# Patient Record
Sex: Female | Born: 1995 | Race: Black or African American | Hispanic: No | Marital: Single | Smoking: Never smoker
Health system: Southern US, Community
[De-identification: ages and names within clinical notes are randomized; demographics above are authoritative.]

## PROBLEM LIST (undated history)

## (undated) DIAGNOSIS — J45909 Unspecified asthma, uncomplicated: Secondary | ICD-10-CM

## (undated) DIAGNOSIS — T7840XA Allergy, unspecified, initial encounter: Secondary | ICD-10-CM

## (undated) HISTORY — DX: Allergy, unspecified, initial encounter: T78.40XA

## (undated) HISTORY — DX: Unspecified asthma, uncomplicated: J45.909

## (undated) HISTORY — PX: TOOTH EXTRACTION: SUR596

## (undated) HISTORY — PX: ANTERIOR CRUCIATE LIGAMENT REPAIR: SHX115

---
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
## 2014-01-08 ENCOUNTER — Ambulatory Visit (INDEPENDENT_AMBULATORY_CARE_PROVIDER_SITE_OTHER): Admitting: Emergency Medicine

## 2014-01-08 VITALS — BP 132/80 | HR 88 | Temp 98.2°F | Resp 18 | Ht 64.0 in | Wt 203.0 lb

## 2014-01-08 DIAGNOSIS — J209 Acute bronchitis, unspecified: Secondary | ICD-10-CM

## 2014-01-08 DIAGNOSIS — J018 Other acute sinusitis: Secondary | ICD-10-CM

## 2014-01-08 MED ORDER — LEVOFLOXACIN 500 MG PO TABS: 500.0000 mg | ORAL_TABLET | Freq: Every day | ORAL | Status: AC

## 2014-01-08 MED ORDER — PROMETHAZINE-CODEINE 6.25-10 MG/5ML PO SYRP: 5.0000 mL | ORAL_SOLUTION | Freq: Four times a day (QID) | ORAL | Status: DC | PRN

## 2014-01-08 MED ORDER — ALBUTEROL SULFATE HFA 108 (90 BASE) MCG/ACT IN AERS: 2.0000 | INHALATION_SPRAY | RESPIRATORY_TRACT | Status: DC | PRN

## 2014-01-08 MED ORDER — PSEUDOEPHEDRINE-GUAIFENESIN ER 60-600 MG PO TB12: 1.0000 | ORAL_TABLET | Freq: Two times a day (BID) | ORAL | Status: AC

## 2014-01-08 MED ORDER — AMOXICILLIN-POT CLAVULANATE 875-125 MG PO TABS: 1.0000 | ORAL_TABLET | Freq: Two times a day (BID) | ORAL | Status: DC

## 2014-01-08 NOTE — Progress Notes (Signed)
[facility] [address], [place] [id number] [phone]  Date:  01/08/2014   Name:  Savannah Baldwin   DOB:  12-30-1995   MRN:  [id number]  PCP:  No primary provider on file.    Chief Complaint: Shortness of Breath, Sore Throat and Cough   History of Present Illness:  Savannah Baldwin is a 18 y.o. very pleasant female patient who presents with the following:  Ill with nasal congestion and pressure in her sinuses with purulent nasal drainage and post nasal drainage.  Has a sore throat.  Has a cough productive purulent sputum.  Some wheezing. No shortness of breath. Fever but no chills.  No nausea or vomiting. No stool change or rash.  No improvement with over the counter medications or other home remedies. Denies other complaint or health concern today.   There are no active problems to display for this patient.   Past Medical History  Diagnosis Date  . Allergy   . Asthma     History reviewed. No pertinent past surgical history.  History  Substance Use Topics  . Smoking status: Never Smoker   . Smokeless tobacco: Not on file  . Alcohol Use: Not on file    Family History  Problem Relation Age of Onset  . Hypertension Mother   . Hyperlipidemia Father     Allergies  Allergen Reactions  . Penicillins     Medication list has been reviewed and updated.  No current outpatient prescriptions on file prior to visit.   No current facility-administered medications on file prior to visit.    Review of Systems:  As per HPI, otherwise negative.    Physical Examination: Filed Vitals:   01/08/14 1518  BP: 132/80  Pulse: 88  Temp: 98.2 F (36.8 C)  Resp: 18   Filed Vitals:   01/08/14 1518  Height: 5\' 4"  (1.626 m)  Weight: 203 lb (92.08 kg)   Body mass index is 34.83 kg/(m^2). Ideal Body Weight: Weight in (lb) to have BMI = 25: 145.3  GEN: WDWN, NAD, Non-toxic, A & O x 3 HEENT: Atraumatic, Normocephalic. Neck supple. No masses, No  LAD. Ears and Nose: No external deformity. CV: RRR, No M/G/R. No JVD. No thrill. No extra heart sounds. PULM: CTA B, no wheezes, crackles, rhonchi. No retractions. No resp. distress. No accessory muscle use. ABD: S, NT, ND, +BS. No rebound. No HSM. EXTR: No c/c/e NEURO Normal gait.  PSYCH: Normally interactive. Conversant. Not depressed or anxious appearing.  Calm demeanor.    Assessment and Plan: Sinusitis Bronchitis augmentin mucinex d Phen c cod   Signed,  Phillips OdorJeffery Avleen Bordwell, MD

## 2014-01-08 NOTE — Patient Instructions (Signed)

## 2017-01-08 ENCOUNTER — Emergency Department (HOSPITAL_BASED_OUTPATIENT_CLINIC_OR_DEPARTMENT_OTHER)

## 2017-01-08 ENCOUNTER — Emergency Department (HOSPITAL_BASED_OUTPATIENT_CLINIC_OR_DEPARTMENT_OTHER)
Admission: EM | Admit: 2017-01-08 | Discharge: 2017-01-08 | Disposition: A | Discharge status: Home / Self Care | Attending: Physician Assistant | Admitting: Physician Assistant

## 2017-01-08 ENCOUNTER — Encounter (HOSPITAL_BASED_OUTPATIENT_CLINIC_OR_DEPARTMENT_OTHER): Payer: Self-pay | Admitting: Respiratory Therapy

## 2017-01-08 DIAGNOSIS — R05 Cough: Secondary | ICD-10-CM | POA: Insufficient documentation

## 2017-01-08 DIAGNOSIS — J3489 Other specified disorders of nose and nasal sinuses: Secondary | ICD-10-CM | POA: Diagnosis not present

## 2017-01-08 DIAGNOSIS — J45909 Unspecified asthma, uncomplicated: Secondary | ICD-10-CM | POA: Insufficient documentation

## 2017-01-08 DIAGNOSIS — R0602 Shortness of breath: Secondary | ICD-10-CM | POA: Diagnosis not present

## 2017-01-08 DIAGNOSIS — J029 Acute pharyngitis, unspecified: Secondary | ICD-10-CM | POA: Diagnosis not present

## 2017-01-08 DIAGNOSIS — Z79899 Other long term (current) drug therapy: Secondary | ICD-10-CM | POA: Insufficient documentation

## 2017-01-08 LAB — RAPID STREP SCREEN (MED CTR MEBANE ONLY): STREPTOCOCCUS, GROUP A SCREEN (DIRECT): NEGATIVE

## 2017-01-08 LAB — PREGNANCY, URINE: PREG TEST UR: NEGATIVE

## 2017-01-08 MED ORDER — PREDNISONE 10 MG PO TABS: 40.0000 mg | ORAL_TABLET | Freq: Every day | ORAL | 0 refills | Status: AC

## 2017-01-08 MED ORDER — ALBUTEROL SULFATE (5 MG/ML) 0.5% IN NEBU: 2.5000 mg | INHALATION_SOLUTION | Freq: Four times a day (QID) | RESPIRATORY_TRACT | 12 refills | Status: DC | PRN

## 2017-01-08 MED ORDER — ALBUTEROL SULFATE HFA 108 (90 BASE) MCG/ACT IN AERS
2.0000 | INHALATION_SPRAY | RESPIRATORY_TRACT | Status: DC | PRN
  Administered 2017-01-08: 2 via RESPIRATORY_TRACT
  Filled 2017-01-08: qty 6.7

## 2017-01-08 MED ORDER — ALBUTEROL SULFATE (2.5 MG/3ML) 0.083% IN NEBU
5.0000 mg | INHALATION_SOLUTION | Freq: Once | RESPIRATORY_TRACT | Status: AC
  Administered 2017-01-08: 5 mg via RESPIRATORY_TRACT
  Filled 2017-01-08: qty 6

## 2017-01-08 MED ORDER — IPRATROPIUM-ALBUTEROL 0.5-2.5 (3) MG/3ML IN SOLN
3.0000 mL | Freq: Four times a day (QID) | RESPIRATORY_TRACT | Status: DC
  Administered 2017-01-08: 3 mL via RESPIRATORY_TRACT
  Filled 2017-01-08: qty 3

## 2017-01-08 NOTE — ED Notes (Signed)
Ambulate pt on pulse OX pt maintained 94-95% O2 without issue. Pt stated short of breath after returning to bed.

## 2017-01-08 NOTE — ED Provider Notes (Signed)
[facility] Provider [facility]   CSN: [id number] Arrival date & time: 01/08/17  1449     History   Chief Complaint Chief Complaint  Patient presents with  . Shortness of Breath    HPI Savannah Manisndrea Baldwin is a 21 y.o. female who presents to the Emergency Department with SOB with associated wheezing, non-productive cough, and sore throat that began yesterday and worsened today.  Denies fever, chills, rash, difficulty swallowing, CP, abdominal pain, or N/V/D. She reports SOB is worse with exertion. No alleviating factors. No treatment PTA. She reports a h/o of childhood asthma, but no recent exacerbations. She states she has an expired albuterol inhaler at home. No previous hospitalizations or intubations.   HPI  Past Medical History:  Diagnosis Date  . Allergy   . Asthma     There are no active problems to display for this patient.   History reviewed. No pertinent surgical history.  OB History    No data available     Home Medications    Prior to Admission medications   Medication Sig Start Date End Date Taking? Authorizing Provider  Etonogestrel (NEXPLANON [place]) Inject into the skin.   Yes Historical Provider, MD  albuterol (PROVENTIL) (5 MG/ML) 0.5% nebulizer solution Take 0.5 mLs (2.5 mg total) by nebulization every 6 (six) hours as needed for wheezing or shortness of breath. 01/08/17   Alvis Pulcini A Kay Shippy, PA-C  predniSONE (DELTASONE) 10 MG tablet Take 4 tablets (40 mg total) by mouth daily with breakfast. 01/08/17 01/12/17  Londell Noll A Taquana Bartley, PA-C  promethazine-codeine (PHENERGAN WITH CODEINE) 6.25-10 MG/5ML syrup Take 5-10 mLs by mouth every 6 (six) hours as needed. 01/08/14   Carmelina DaneJeffery S Anderson, MD    Family History Family History  Problem Relation Age of Onset  . Hypertension Mother   . Hyperlipidemia Father     Social History Social History  Substance Use Topics  . Smoking status: Never Smoker  . Smokeless tobacco: Never Used  . Alcohol use Yes     Allergies     Penicillins   Review of Systems Review of Systems  Constitutional: Negative for appetite change and fatigue.  HENT: Positive for rhinorrhea and sore throat. Negative for congestion, ear discharge and sinus pressure.   Eyes: Negative for discharge.  Respiratory: Positive for cough, shortness of breath and wheezing. Negative for apnea.   Cardiovascular: Negative for chest pain.  Gastrointestinal: Negative for abdominal pain, diarrhea, nausea and vomiting.  Genitourinary: Negative for frequency and hematuria.  Musculoskeletal: Negative for back pain.  Skin: Negative for rash.  Neurological: Negative for seizures and headaches.  Psychiatric/Behavioral: Negative for hallucinations.   Physical Exam Updated Vital Signs BP 132/90 (BP Location: Right Arm)   Pulse (!) 103   Temp 98.3 F (36.8 C) (Oral)   Resp 18   Ht 5' 3.25" (1.607 m)   Wt 88.6 kg   SpO2 96%   BMI 34.33 kg/m   Physical Exam  Constitutional: She appears well-developed and well-nourished. No distress.  HENT:  Head: Normocephalic and atraumatic.  Nose: Mucosal edema and rhinorrhea present.  Mouth/Throat: Uvula is midline and mucous membranes are normal. Posterior oropharyngeal erythema present. No tonsillar abscesses. Tonsillar exudate.  Eyes: Conjunctivae are normal.  Neck: Neck supple.  Cardiovascular: Normal rate and regular rhythm.   No murmur heard. Pulmonary/Chest: Effort normal. No respiratory distress. She has wheezes.  Abdominal: Soft. There is no tenderness.  Musculoskeletal: She exhibits no edema.  Neurological: She is alert.  Skin: Skin is warm  and dry.  Psychiatric: She has a normal mood and affect.  Nursing note and vitals reviewed.    ED Treatments / Results  Labs (all labs ordered are listed, but only abnormal results are displayed) Labs Reviewed  RAPID STREP SCREEN (NOT AT [facility])  CULTURE, GROUP A STREP [facility])  PREGNANCY, URINE    EKG  EKG Interpretation None       Radiology Dg  Chest 2 View  Result Date: 01/08/2017 CLINICAL DATA:  Dyspnea for 2 days. Cough for 2 days. History of asthma. EXAM: CHEST  2 VIEW COMPARISON:  04/28/2004 FINDINGS: The heart size and mediastinal contours are within normal limits. Both lungs are clear. The visualized skeletal structures are unremarkable. IMPRESSION: No active cardiopulmonary disease. Electronically Signed   By: Elige Ko   On: 01/08/2017 17:00    Procedures Procedures (including critical care time)  Medications Ordered in ED Medications  albuterol (PROVENTIL) (2.5 MG/3ML) 0.083% nebulizer solution 5 mg (5 mg Nebulization Given 01/08/17 1507)    Initial Impression / Assessment and Plan / ED Course  I have reviewed the triage vital signs and the nursing notes.  Pertinent labs & imaging results that were available during my care of the patient were reviewed by me and considered in my medical decision making (see chart for details).     Patient ambulated in ED with O2 saturations maintained >90, no current signs of respiratory distress. Lung exam improved after nebulizer treatment. Will dc with 4 day burst and new albuterol inhaler. Pt states they are breathing at baseline. Pt has been instructed to continue using prescribed medications and to speak with PCP about today's exacerbation.   Final Clinical Impressions(s) / ED Diagnoses   Final diagnoses:  SOB (shortness of breath)    New Prescriptions Discharge Medication List as of 01/08/2017  6:21 PM    START taking these medications   Details  albuterol (PROVENTIL) (5 MG/ML) 0.5% nebulizer solution Take 0.5 mLs (2.5 mg total) by nebulization every 6 (six) hours as needed for wheezing or shortness of breath., Starting Tue 01/08/2017, Print    predniSONE (DELTASONE) 10 MG tablet Take 4 tablets (40 mg total) by mouth daily with breakfast., Starting Tue 01/08/2017, Until Sat 01/12/2017, Print         Shirel Mallis A Alaisa Moffitt, PA-C 01/09/17 0127    Courteney Lyn Mackuen,  MD 01/13/17 6045

## 2017-01-08 NOTE — ED Notes (Signed)
Pt walking to xray at this time. Speaking full sentences

## 2017-01-08 NOTE — ED Triage Notes (Signed)
Sob. Wheezing. Able to speak in complete sentences. No resp distress.

## 2017-01-08 NOTE — Discharge Instructions (Signed)
Please follow up with your primary care provider in one week. Return to the Emergency Department if you symptoms worsen or if you develop new symptoms.

## 2017-01-11 LAB — CULTURE, GROUP A STREP (THRC)

## 2017-04-25 ENCOUNTER — Encounter (HOSPITAL_BASED_OUTPATIENT_CLINIC_OR_DEPARTMENT_OTHER): Payer: Self-pay | Admitting: *Deleted

## 2017-04-25 ENCOUNTER — Emergency Department (HOSPITAL_BASED_OUTPATIENT_CLINIC_OR_DEPARTMENT_OTHER)

## 2017-04-25 ENCOUNTER — Emergency Department (HOSPITAL_BASED_OUTPATIENT_CLINIC_OR_DEPARTMENT_OTHER)
Admission: EM | Admit: 2017-04-25 | Discharge: 2017-04-25 | Disposition: A | Discharge status: Home / Self Care | Attending: Emergency Medicine | Admitting: Emergency Medicine

## 2017-04-25 DIAGNOSIS — J45909 Unspecified asthma, uncomplicated: Secondary | ICD-10-CM | POA: Insufficient documentation

## 2017-04-25 DIAGNOSIS — S39012A Strain of muscle, fascia and tendon of lower back, initial encounter: Secondary | ICD-10-CM | POA: Diagnosis not present

## 2017-04-25 DIAGNOSIS — Y9389 Activity, other specified: Secondary | ICD-10-CM | POA: Insufficient documentation

## 2017-04-25 DIAGNOSIS — Y999 Unspecified external cause status: Secondary | ICD-10-CM | POA: Insufficient documentation

## 2017-04-25 DIAGNOSIS — Y92410 Unspecified street and highway as the place of occurrence of the external cause: Secondary | ICD-10-CM | POA: Diagnosis not present

## 2017-04-25 DIAGNOSIS — S3992XA Unspecified injury of lower back, initial encounter: Secondary | ICD-10-CM | POA: Diagnosis present

## 2017-04-25 LAB — PREGNANCY, URINE: PREG TEST UR: NEGATIVE

## 2017-04-25 MED ORDER — ACETAMINOPHEN 500 MG PO TABS
ORAL_TABLET | ORAL | Status: AC
  Administered 2017-04-25: 1000 mg
  Filled 2017-04-25: qty 2

## 2017-04-25 MED ORDER — NAPROXEN 375 MG PO TABS: 375.0000 mg | ORAL_TABLET | Freq: Two times a day (BID) | ORAL | 0 refills | Status: DC

## 2017-04-25 MED ORDER — METHOCARBAMOL 500 MG PO TABS: 500.0000 mg | ORAL_TABLET | Freq: Two times a day (BID) | ORAL | 0 refills | Status: DC

## 2017-04-25 MED ORDER — METHOCARBAMOL 500 MG PO TABS
ORAL_TABLET | ORAL | Status: AC
  Administered 2017-04-25: 1000 mg
  Filled 2017-04-25: qty 2
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
## 2017-11-04 ENCOUNTER — Emergency Department (HOSPITAL_BASED_OUTPATIENT_CLINIC_OR_DEPARTMENT_OTHER)

## 2017-11-04 ENCOUNTER — Other Ambulatory Visit: Payer: Self-pay

## 2017-11-04 ENCOUNTER — Emergency Department (HOSPITAL_BASED_OUTPATIENT_CLINIC_OR_DEPARTMENT_OTHER)
Admission: EM | Admit: 2017-11-04 | Discharge: 2017-11-05 | Disposition: A | Discharge status: Home / Self Care | Attending: Emergency Medicine | Admitting: Emergency Medicine

## 2017-11-04 ENCOUNTER — Encounter (HOSPITAL_BASED_OUTPATIENT_CLINIC_OR_DEPARTMENT_OTHER): Payer: Self-pay | Admitting: *Deleted

## 2017-11-04 DIAGNOSIS — J45909 Unspecified asthma, uncomplicated: Secondary | ICD-10-CM | POA: Diagnosis not present

## 2017-11-04 DIAGNOSIS — R110 Nausea: Secondary | ICD-10-CM

## 2017-11-04 DIAGNOSIS — R509 Fever, unspecified: Secondary | ICD-10-CM | POA: Diagnosis present

## 2017-11-04 DIAGNOSIS — R059 Cough, unspecified: Secondary | ICD-10-CM

## 2017-11-04 DIAGNOSIS — J069 Acute upper respiratory infection, unspecified: Secondary | ICD-10-CM | POA: Diagnosis not present

## 2017-11-04 DIAGNOSIS — Z79899 Other long term (current) drug therapy: Secondary | ICD-10-CM | POA: Diagnosis not present

## 2017-11-04 DIAGNOSIS — R05 Cough: Secondary | ICD-10-CM

## 2017-11-04 MED ORDER — ONDANSETRON 8 MG PO TBDP
8.0000 mg | ORAL_TABLET | Freq: Once | ORAL | Status: AC
  Administered 2017-11-05: 8 mg via ORAL
  Filled 2017-11-04: qty 1

## 2017-11-04 MED ORDER — SODIUM CHLORIDE 0.9 % IV BOLUS (SEPSIS)
1000.0000 mL | Freq: Once | INTRAVENOUS | Status: AC
  Administered 2017-11-05: 1000 mL via INTRAVENOUS

## 2017-11-04 NOTE — ED Triage Notes (Signed)
Pt c/o flu like symptoms x 4 days, cough fever, n/d

## 2017-11-04 NOTE — ED Notes (Signed)
Patient transported to X-ray 

## 2017-11-05 LAB — BASIC METABOLIC PANEL
Anion gap: 9 (ref 5–15)
BUN: 9 mg/dL (ref 6–20)
CALCIUM: 8.9 mg/dL (ref 8.9–10.3)
CO2: 23 mmol/L (ref 22–32)
Chloride: 104 mmol/L (ref 101–111)
Creatinine, Ser: 0.7 mg/dL (ref 0.44–1.00)
GFR calc Af Amer: >60 mL/min (ref >60)
GFR calc non Af Amer: >60 mL/min (ref >60)
GLUCOSE: 112 mg/dL — AB (ref 65–99)
POTASSIUM: 3.2 mmol/L — AB (ref 3.5–5.1)
SODIUM: 136 mmol/L (ref 135–145)

## 2017-11-05 LAB — PREGNANCY, URINE: Preg Test, Ur: NEGATIVE

## 2017-11-05 LAB — RAPID STREP SCREEN (MED CTR MEBANE ONLY): STREPTOCOCCUS, GROUP A SCREEN (DIRECT): NEGATIVE

## 2017-11-05 MED ORDER — ONDANSETRON HCL 4 MG PO TABS: 4.0000 mg | ORAL_TABLET | Freq: Three times a day (TID) | ORAL | 0 refills | Status: DC | PRN

## 2017-11-05 MED ORDER — DM-GUAIFENESIN ER 30-600 MG PO TB12: 1.0000 | ORAL_TABLET | Freq: Two times a day (BID) | ORAL | 0 refills | Status: AC

## 2017-11-05 MED ORDER — BENZONATATE 100 MG PO CAPS: 100.0000 mg | ORAL_CAPSULE | Freq: Three times a day (TID) | ORAL | 0 refills | Status: DC

## 2017-11-05 MED ORDER — ALBUTEROL SULFATE HFA 108 (90 BASE) MCG/ACT IN AERS
1.0000 | INHALATION_SPRAY | Freq: Four times a day (QID) | RESPIRATORY_TRACT | 0 refills | Status: DC | PRN
Start: 2017-11-05 — End: 2018-09-09

## 2017-11-05 MED ORDER — POTASSIUM CHLORIDE CRYS ER 20 MEQ PO TBCR: 20.0000 meq | EXTENDED_RELEASE_TABLET | Freq: Every day | ORAL | 0 refills | Status: DC

## 2017-11-05 NOTE — ED Provider Notes (Signed)
[facility] EMERGENCY DEPARTMENT Provider Note   CSN: [id number] Arrival date & time: 11/04/17  2302     History   Chief Complaint Chief Complaint  Patient presents with  . Influenza    HPI Savannah Baldwin is a 22 y.o. female.  HPI  Patient is a 22 year old female with a history of asthma and allergic rhinitis presenting for fever, cough, congestion, rhinorrhea, nausea, and loose stools.  Patient reports that her symptoms began 3 days ago.  Patient reports that she has had general body soreness, and congestion that interferes with breathing while lying down.  Patient reports that she has not been able to record a fever at home, however she has felt chilled.  Additionally, patient reports she began having a sore throat 2 days ago, that is mostly irritated when she coughs now.  Patient has been nauseous without vomiting.  Patient reports she had 2 episodes of loose stools today, but they were not watery.  Patient denies blood in stools.  Patient reports that she did not get the flu shot this year.  Patient has had no recent sick contacts.  Patient has taken TheraFlu and ibuprofen for her symptoms without full relief.  Past Medical History:  Diagnosis Date  . Allergy   . Asthma     There are no active problems to display for this patient.   History reviewed. No pertinent surgical history.  OB History    No data available       Home Medications    Prior to Admission medications   Medication Sig Start Date End Date Taking? Authorizing Provider  ibuprofen (ADVIL,MOTRIN) 400 MG tablet Take 400 mg by mouth every 6 (six) hours as needed.   Yes [provider]  Pseudoephedrine-DM-GG-APAP (THERAFLU MAX-D COLD & FLU PO) Take by mouth.   Yes [provider]  albuterol (PROVENTIL) (5 MG/ML) 0.5% nebulizer solution Take 0.5 mLs (2.5 mg total) by nebulization every 6 (six) hours as needed for wheezing or shortness of breath. 01/08/17   McDonald, Mia A, PA-C    Etonogestrel (NEXPLANON [place]) Inject into the skin.    [provider]  naproxen (NAPROSYN) 375 MG tablet Take 1 tablet (375 mg total) by mouth 2 (two) times daily. 04/25/17   Palumbo, April, MD  promethazine-codeine (PHENERGAN WITH CODEINE) 6.25-10 MG/5ML syrup Take 5-10 mLs by mouth every 6 (six) hours as needed. 01/08/14   Carmelina Dane, MD    Family History Family History  Problem Relation Age of Onset  . Hypertension Mother   . Hyperlipidemia Father     Social History Social History   Tobacco Use  . Smoking status: Never Smoker  . Smokeless tobacco: Never Used  Substance Use Topics  . Alcohol use: Yes  . Drug use: Not on file     Allergies   Penicillins   Review of Systems Review of Systems  Constitutional: Positive for chills. Negative for fever.  HENT: Positive for congestion, rhinorrhea and sore throat. Negative for trouble swallowing and voice change.   Eyes: Negative for visual disturbance.  Respiratory: Positive for cough. Negative for wheezing.   Cardiovascular: Negative for chest pain.  Gastrointestinal: Positive for diarrhea and nausea. Negative for abdominal pain and vomiting.  Genitourinary: Negative for dysuria.  Musculoskeletal: Positive for myalgias.  Skin: Negative for rash.  Neurological: Positive for headaches.     Physical Exam Updated Vital Signs BP (!) 149/87   Pulse (!) 116   Temp 99.7 F (37.6 C)  Resp 20   Ht 5\' 4"  (1.626 m)   Wt 79.4 kg (175 lb)   SpO2 96%   BMI 30.04 kg/m   Physical Exam  Constitutional: She appears well-developed and well-nourished. No distress.  HENT:  Head: Normocephalic and atraumatic.  Mouth/Throat: Oropharynx is clear and moist.  Normal phonation. No muffled voice sounds. Patient swallows secretions without difficulty. Dentition normal. No lesions of tongue or buccal mucosa. Uvula midline. No asymmetric swelling of the posterior pharynx. No erythema of posterior pharynx. No tonsillar  exuduate. No lingual swelling. No induration inferior to tongue. No submandibular tenderness, swelling, or induration.  Tissues of the neck supple. No cervical lymphadenopathy. Right TM without erythema or effusion; left TM without erythema or effusion.  Eyes: Conjunctivae and EOM are normal. Pupils are equal, round, and reactive to light.  Neck: Normal range of motion. Neck supple.  No nuchal rigidity.  No meningeal signs.  Negative Brudzinski sign.  Cardiovascular: Normal rate, regular rhythm, S1 normal and S2 normal.  No murmur heard. Pulmonary/Chest: Effort normal and breath sounds normal. She has no wheezes. She has no rales.  Abdominal: Soft. She exhibits no distension. There is no tenderness. There is no guarding.  Musculoskeletal: Normal range of motion. She exhibits no edema or deformity.  Lymphadenopathy:    She has no cervical adenopathy.  Neurological: She is alert.  Cranial nerves grossly intact. Patient moves extremities symmetrically and with good coordination.  Skin: Skin is warm and dry. No rash noted. No erythema.  Psychiatric: She has a normal mood and affect. Her behavior is normal. Judgment and thought content normal.  Nursing note and vitals reviewed.    ED Treatments / Results  Labs (all labs ordered are listed, but only abnormal results are displayed) Labs Reviewed  BASIC METABOLIC PANEL - Abnormal; Notable for the following components:      Result Value   Potassium 3.2 (*)    Glucose, Bld 112 (*)    All other components within normal limits  RAPID STREP SCREEN (NOT AT [facility])  CULTURE, GROUP A STREP [facility]  PREGNANCY, URINE    EKG  EKG Interpretation None       Radiology Dg Chest 2 View  Result Date: 11/04/2017 CLINICAL DATA:  Cough and fever EXAM: CHEST  2 VIEW COMPARISON:  01/08/2017 FINDINGS: Mild bronchitic changes. No focal consolidation or effusion. Normal heart size. No pneumothorax IMPRESSION: Mild bronchitic changes.  No focal pulmonary  infiltrate Electronically Signed   By: Jasmine PangKim  Fujinaga M.D.   On: 11/04/2017 23:58    Procedures Procedures (including critical care time)  Medications Ordered in ED Medications  sodium chloride 0.9 % bolus 1,000 mL (1,000 mLs Intravenous New Bag/Given 11/05/17 0004)  ondansetron (ZOFRAN-ODT) disintegrating tablet 8 mg (8 mg Oral Given 11/05/17 0004)     Initial Impression / Assessment and Plan / ED Course  I have reviewed the triage vital signs and the nursing notes.  Pertinent labs & imaging results that were available during my care of the patient were reviewed by me and considered in my medical decision making (see chart for details).      Final Clinical Impressions(s) / ED Diagnoses   Final diagnoses:  Viral URI  Nausea  Cough   Patient is nontoxic-appearing, afebrile, and in no acute distress.  Patient is not tachycardic.  Pulse was rechecked on my evaluation and found to be 82 after 1 L normal saline repletion.  Patient with likely viral syndrome.  No evidence of pneumonia on  chest x-ray.  Chest x-ray with bronchitic thickening possibly consistent with bronchitis.  Rapid strep is negative.  Will treat patient symptomatically with Zofran, Mucinex DM, Tessalon Perles, and refill of albuterol prescription.  Abdomen is benign and without focal tenderness.  Do not suspect acute abdominal  pathology as cause of nausea and loose stool.  Incidentally, patient was found to have potassium of 3.2.  This will be repleted with 3 days of oral potassium.  This is a supervised visit with Dr. Brock Bad. Evaluation, management, and discharge planning discussed with this attending physician, who is in agreement with the plan of care.  Patient can return precautions for any persistent fever or chills, intractable nausea or vomiting, focal abdominal tenderness, dizziness, lightheadedness or syncope.  Patient is in understanding and agrees with the plan of care.  ED Discharge Orders    None        Delia Chimes 11/05/17 0149    Aviva Kluver B, PA-C 11/05/17 0150    Paula Libra, MD 11/05/17 [phone]

## 2017-11-05 NOTE — Discharge Instructions (Signed)
Please read and follow all provided instructions.  Your diagnoses today include:  1. Viral URI   2. Nausea   3. Cough     You appear to have an upper respiratory infection (URI). An upper respiratory tract infection, or cold, is a viral infection of the air passages leading to the lungs. It should improve gradually after 5-7 days. You may have a lingering cough that lasts for 2- 4 weeks after the infection.  Tests performed today include: Vital signs. See below for your results today.  Chest x-ray Swab of the throat for strep  Medications prescribed:   Take any prescribed medications only as directed. Treatment for your infection is aimed at treating the symptoms. There are no medications, such as antibiotics, that will cure your infection.   Zofran for nausea.  He may take this every 8 hours as needed for nausea.  Mucinex DM.  He may take this twice a day to help suppress the cough and loosen up the mucus in your nose and chest.  I refilled your albuterol inhaler prescription  You will take 3 days of potassium.  You may also eat the foods listed below to increase her potassium.  On testing today, your potassium level was 3.2  which is slightly below normal. I suspect this is due to diarrhea. Increasing potassium in your diet should be sufficient to make sure you are getting enough potassium. Below is a list of good sources and servings high in potassium (in order from greatest to least):  -1 cup cooked acorn squash -1 baked potato with skin -1 cup cooked spinach -1 cup cooked lentils -1 cup cooked kidney beans -Watermelon - cup raisins -1 cup plain yogurt -1 cup orange juice, frozen -Banana -1 cup 1% low-fat milk -1 cup cooked broccoli   Home care instructions:  Follow any educational materials contained in this packet.   Your illness is contagious and can be spread to others, especially during the first 3 or 4 days. It cannot be cured by antibiotics or other  medicines. Take basic precautions such as washing your hands often, covering your mouth when you cough or sneeze, and avoiding public places where you could spread your illness to others.   Please continue drinking plenty of fluids.  Use over-the-counter medicines as needed as directed on packaging for symptom relief.  You may also use ibuprofen or tylenol as directed on packaging for pain or fever.  Do not take multiple medicines containing Tylenol or acetaminophen to avoid taking too much of this medication.  Follow-up instructions: Please follow-up with your primary care provider in the next 3 days for further evaluation of your symptoms if you are not feeling better.   Return instructions:  Please return to the Emergency Department if you experience worsening symptoms.  RETURN IMMEDIATELY IF you develop shortness of breath, confusion or altered mental status, a new rash, become dizzy, faint, or poorly responsive, or are unable to be cared for at home. Please return if you have persistent vomiting and cannot keep down fluids or develop a fever that is not controlled by tylenol or motrin.   Return if you have any increasing or focal belly pain that is persistent without pressing on it. Please return if you have any other emergent concerns.  Additional Information:  Your blood pressure is elevated today.  I would like you to follow-up with her primary care provider that I list in this paperwork.  There is also a resource guide attached to  this paperwork.  To find a primary care or specialty doctor please call [phone] or [phone] to access "[facility] Find a Doctor Service."  You may also go on the [facility] website at [email or web address]  There are also multiple [facility] practices throughout the [place] that are frequently accepting new patients. You may find a clinic that is close to your home and contact them.  [facility] - [address] [place] [phone]  [facility] in [place] (also locations in [place] and [place]) - [address] [employer] [place] [phone]  [facility] - [address] [phone]    Your vital signs today were: BP (!) 149/87    Pulse (!) 116    Temp 99.7 F (37.6 C)    Resp 20    Ht 5\' 4"  (1.626 m)    Wt 79.4 kg (175 lb)    SpO2 96%    BMI 30.04 kg/m  If your blood pressure (BP) was elevated above 135/85 this visit, please have this repeated by your doctor within one month. --------------

## 2017-11-07 LAB — CULTURE, GROUP A STREP (THRC)

## 2018-09-03 ENCOUNTER — Encounter (HOSPITAL_COMMUNITY): Payer: Self-pay | Admitting: Emergency Medicine

## 2018-09-03 ENCOUNTER — Emergency Department (HOSPITAL_COMMUNITY)
Admission: EM | Admit: 2018-09-03 | Discharge: 2018-09-04 | Disposition: A | Discharge status: Home / Self Care | Payer: Self-pay | Attending: Emergency Medicine | Admitting: Emergency Medicine

## 2018-09-03 ENCOUNTER — Other Ambulatory Visit: Payer: Self-pay

## 2018-09-03 ENCOUNTER — Emergency Department (HOSPITAL_COMMUNITY): Payer: Self-pay

## 2018-09-03 DIAGNOSIS — Z79899 Other long term (current) drug therapy: Secondary | ICD-10-CM | POA: Insufficient documentation

## 2018-09-03 DIAGNOSIS — J45909 Unspecified asthma, uncomplicated: Secondary | ICD-10-CM | POA: Insufficient documentation

## 2018-09-03 DIAGNOSIS — R1031 Right lower quadrant pain: Secondary | ICD-10-CM | POA: Insufficient documentation

## 2018-09-03 DIAGNOSIS — N12 Tubulo-interstitial nephritis, not specified as acute or chronic: Secondary | ICD-10-CM | POA: Insufficient documentation

## 2018-09-03 LAB — COMPREHENSIVE METABOLIC PANEL
ALBUMIN: 3.9 g/dL (ref 3.5–5.0)
ALT: 44 U/L (ref 0–44)
AST: 36 U/L (ref 15–41)
Alkaline Phosphatase: 61 U/L (ref 38–126)
Anion gap: 12 (ref 5–15)
BUN: 8 mg/dL (ref 6–20)
CALCIUM: 8.9 mg/dL (ref 8.9–10.3)
CO2: 22 mmol/L (ref 22–32)
CREATININE: 0.99 mg/dL (ref 0.44–1.00)
Chloride: 101 mmol/L (ref 98–111)
GFR calc Af Amer: >60 mL/min (ref >60)
GFR calc non Af Amer: >60 mL/min (ref >60)
Glucose, Bld: 113 mg/dL — ABNORMAL HIGH (ref 70–99)
POTASSIUM: 3.4 mmol/L — AB (ref 3.5–5.1)
Sodium: 135 mmol/L (ref 135–145)
TOTAL PROTEIN: 7.8 g/dL (ref 6.5–8.1)
Total Bilirubin: 1.9 mg/dL — ABNORMAL HIGH (ref 0.3–1.2)

## 2018-09-03 LAB — URINALYSIS, ROUTINE W REFLEX MICROSCOPIC
Bilirubin Urine: NEGATIVE
GLUCOSE, UA: NEGATIVE mg/dL
Ketones, ur: 80 mg/dL — AB
NITRITE: NEGATIVE
PROTEIN: 100 mg/dL — AB
SPECIFIC GRAVITY, URINE: 1.014 (ref 1.005–1.030)
WBC, UA: >50 WBC/hpf — ABNORMAL HIGH (ref 0–5)
pH: 6 (ref 5.0–8.0)

## 2018-09-03 LAB — I-STAT BETA HCG BLOOD, ED (MC, WL, AP ONLY): I-stat hCG, quantitative: 5.6 m[IU]/mL — ABNORMAL HIGH (ref <5)

## 2018-09-03 LAB — CBC
HCT: 44 % (ref 36.0–46.0)
Hemoglobin: 14.7 g/dL (ref 12.0–15.0)
MCH: 27.4 pg (ref 26.0–34.0)
MCHC: 33.4 g/dL (ref 30.0–36.0)
MCV: 82.1 fL (ref 80.0–100.0)
PLATELETS: 216 10*3/uL (ref 150–400)
RBC: 5.36 MIL/uL — ABNORMAL HIGH (ref 3.87–5.11)
RDW: 13.9 % (ref 11.5–15.5)
WBC: 18.6 10*3/uL — AB (ref 4.0–10.5)
nRBC: 0 % (ref 0.0–0.2)

## 2018-09-03 LAB — LIPASE, BLOOD: Lipase: 20 U/L (ref 11–51)

## 2018-09-03 LAB — HCG, QUANTITATIVE, PREGNANCY

## 2018-09-03 MED ORDER — SODIUM CHLORIDE (PF) 0.9 % IJ SOLN
INTRAMUSCULAR | Status: AC
  Filled 2018-09-03: qty 50

## 2018-09-03 MED ORDER — IOPAMIDOL (ISOVUE-300) INJECTION 61%
INTRAVENOUS | Status: AC
  Filled 2018-09-03: qty 100

## 2018-09-03 MED ORDER — MORPHINE SULFATE (PF) 4 MG/ML IV SOLN
4.0000 mg | Freq: Once | INTRAVENOUS | Status: AC
  Administered 2018-09-04: 4 mg via INTRAVENOUS
  Filled 2018-09-03: qty 1

## 2018-09-03 MED ORDER — ONDANSETRON HCL 4 MG/2ML IJ SOLN
4.0000 mg | Freq: Once | INTRAMUSCULAR | Status: AC
  Administered 2018-09-03: 4 mg via INTRAVENOUS
  Filled 2018-09-03: qty 2

## 2018-09-03 MED ORDER — IOPAMIDOL (ISOVUE-300) INJECTION 61%
100.0000 mL | Freq: Once | INTRAVENOUS | Status: AC | PRN
  Administered 2018-09-03: 100 mL via INTRAVENOUS

## 2018-09-03 MED ORDER — MORPHINE SULFATE (PF) 4 MG/ML IV SOLN
4.0000 mg | Freq: Once | INTRAVENOUS | Status: AC
  Administered 2018-09-03: 4 mg via INTRAVENOUS
  Filled 2018-09-03: qty 1

## 2018-09-03 MED ORDER — ONDANSETRON HCL 4 MG/2ML IJ SOLN
4.0000 mg | Freq: Once | INTRAMUSCULAR | Status: AC
  Administered 2018-09-04: 4 mg via INTRAVENOUS
  Filled 2018-09-03: qty 2

## 2018-09-03 MED ORDER — SODIUM CHLORIDE 0.9 % IV BOLUS
1000.0000 mL | Freq: Once | INTRAVENOUS | Status: AC
  Administered 2018-09-03: 1000 mL via INTRAVENOUS

## 2018-09-03 NOTE — ED Notes (Addendum)
Pt in 6/10 pain

## 2018-09-03 NOTE — ED Provider Notes (Signed)
[facility]-EMERGENCY DEPT Provider Note   CSN: [id number] Arrival date & time: 09/03/18  1854     History   Chief Complaint Chief Complaint  Patient presents with  . Abdominal Pain    HPI Savannah Baldwin is a 22 y.o. female with history of asthma who presents with a 2-day history of right lower quadrant pain.  Patient has had associated nausea vomiting and constipation.  Patient reports for the past 2 days, but no urinary symptoms preceding her pain.  She has had some pain wrapping around her back.  She denies any abnormal vaginal bleeding or discharge.  LMP 08/27/2018 and was normal.  Patient has had a fever up to 104.2 and took Tylenol prior to arrival.  No diarrhea or bloody stools.  Patient was sent from urgent care for further evaluation.  Patient denies any chest pain or shortness of breath.  She has no concern for STD exposure.  She is sexually active and has Nexplanon for birth control.  Last PO intake at 1230 today with the patient tried to have a bite of soup, however vomited and had no appetite.  HPI  Past Medical History:  Diagnosis Date  . Allergy   . Asthma     There are no active problems to display for this patient.   History reviewed. No pertinent surgical history.   OB History   None      Home Medications    Prior to Admission medications   Medication Sig Start Date End Date Taking? Authorizing Provider  acetaminophen (TYLENOL) 160 MG/5ML liquid Take 500 mg by mouth every 4 (four) hours as needed for fever.    Yes [provider]  albuterol (PROVENTIL HFA;VENTOLIN HFA) 108 (90 Base) MCG/ACT inhaler Inhale 1-2 puffs into the lungs every 6 (six) hours as needed for wheezing or shortness of breath. 11/05/17  Yes [place] Scrape, Alyssa B, PA-C  esomeprazole (NEXIUM) 40 MG capsule Take 40 mg by mouth daily as needed for heartburn.   Yes [provider]  Etonogestrel (NEXPLANON [place]) Inject into the skin.   Yes [provider]  naproxen sodium (ALEVE) 220 MG tablet Take 440 mg by mouth daily as needed (pain).   Yes [provider]  benzonatate (TESSALON) 100 MG capsule Take 1 capsule (100 mg total) by mouth every 8 (eight) hours. Patient not taking: Reported on 09/03/2018 11/05/17   Aviva Kluver B, PA-C  naproxen (NAPROSYN) 375 MG tablet Take 1 tablet (375 mg total) by mouth 2 (two) times daily. Patient not taking: Reported on 09/03/2018 04/25/17   Palumbo, April, MD  ondansetron (ZOFRAN) 4 MG tablet Take 1 tablet (4 mg total) by mouth every 8 (eight) hours as needed for nausea or vomiting. Patient not taking: Reported on 09/03/2018 11/05/17   Aviva Kluver B, PA-C  potassium chloride SA (K-DUR,KLOR-CON) 20 MEQ tablet Take 1 tablet (20 mEq total) by mouth daily. Patient not taking: Reported on 09/03/2018 11/05/17   Elisha Ponder, PA-C  promethazine-codeine (PHENERGAN WITH CODEINE) 6.25-10 MG/5ML syrup Take 5-10 mLs by mouth every 6 (six) hours as needed. Patient not taking: Reported on 09/03/2018 01/08/14   Carmelina Dane, MD    Family History Family History  Problem Relation Age of Onset  . Hypertension Mother   . Hyperlipidemia Father     Social History Social History   Tobacco Use  . Smoking status: Never Smoker  . Smokeless tobacco: Never Used  Substance Use Topics  . Alcohol use: Yes  .  Drug use: Not on file     Allergies   Penicillins   Review of Systems Review of Systems  Constitutional: Positive for fever. Negative for chills.  HENT: Negative for facial swelling and sore throat.   Respiratory: Negative for shortness of breath.   Cardiovascular: Negative for chest pain.  Gastrointestinal: Positive for abdominal pain, constipation, nausea and vomiting. Negative for blood in stool and diarrhea.  Genitourinary: Positive for frequency. Negative for dysuria, vaginal bleeding and vaginal discharge.  Musculoskeletal: Positive for back pain.  Skin: Negative for rash and wound.   Neurological: Negative for headaches.  Psychiatric/Behavioral: The patient is not nervous/anxious.      Physical Exam Updated Vital Signs BP 124/74 (BP Location: Left Arm)   Pulse (!) 112   Temp 98.8 F (37.1 C) (Oral)   Resp 20   LMP 08/27/2018   SpO2 100%   Physical Exam  Constitutional: She appears well-developed and well-nourished. No distress.  HENT:  Head: Normocephalic and atraumatic.  Mouth/Throat: Oropharynx is clear and moist. No oropharyngeal exudate.  Eyes: Pupils are equal, round, and reactive to light. Conjunctivae are normal. Right eye exhibits no discharge. Left eye exhibits no discharge. No scleral icterus.  Neck: Normal range of motion. Neck supple. No thyromegaly present.  Cardiovascular: Normal rate, regular rhythm, normal heart sounds and intact distal pulses. Exam reveals no gallop and no friction rub.  No murmur heard. Pulmonary/Chest: Effort normal and breath sounds normal. No stridor. No respiratory distress. She has no wheezes. She has no rales.  Abdominal: Soft. Bowel sounds are normal. She exhibits no distension. There is tenderness in the right lower quadrant. There is tenderness at McBurney's point. There is no rebound and no guarding.    Positive Rovsing's and obturator sign  Musculoskeletal: She exhibits no edema.       Back:  Lymphadenopathy:    She has no cervical adenopathy.  Neurological: She is alert. Coordination normal.  Skin: Skin is warm and dry. No rash noted. She is not diaphoretic. No pallor.  Psychiatric: She has a normal mood and affect.  Nursing note and vitals reviewed.    ED Treatments / Results  Labs (all labs ordered are listed, but only abnormal results are displayed) Labs Reviewed  COMPREHENSIVE METABOLIC PANEL - Abnormal; Notable for the following components:      Result Value   Potassium 3.4 (*)    Glucose, Bld 113 (*)    Total Bilirubin 1.9 (*)    All other components within normal limits  CBC - Abnormal;  Notable for the following components:   WBC 18.6 (*)    RBC 5.36 (*)    All other components within normal limits  I-STAT BETA HCG BLOOD, ED ([facility], [facility], [facility] ONLY) - Abnormal; Notable for the following components:   I-stat hCG, quantitative 5.6 (*)    All other components within normal limits  LIPASE, BLOOD  HCG, QUANTITATIVE, PREGNANCY  URINALYSIS, ROUTINE W REFLEX MICROSCOPIC    EKG None  Radiology No results found.  Procedures Procedures (including critical care time)  Medications Ordered in ED Medications  sodium chloride 0.9 % bolus 1,000 mL (1,000 mLs Intravenous New Bag/Given 09/03/18 2209)  morphine 4 MG/ML injection 4 mg (4 mg Intravenous Given 09/03/18 2205)  ondansetron (ZOFRAN) injection 4 mg (4 mg Intravenous Given 09/03/18 2203)     Initial Impression / Assessment and Plan / ED Course  I have reviewed the triage vital signs and the nursing notes.  Pertinent labs & imaging  results that were available during my care of the patient were reviewed by me and considered in my medical decision making (see chart for details).     Patient presenting with abdominal pain concerning for appendicitis.  She began with right lower quadrant pain 2 days ago.  She has had nausea vomiting and decreased appetite.  She has had urinary frequency only after the pain began.  Prior to that, she was feeling well and had no urinary symptoms.  Patient has leukocytosis of 18.6.  Mild hypokalemia 3.4.  Total bilirubin elevated at 1.9.  UA is pending.  CT abdomen pelvis is pending.  Patient has no vaginal discharge or pelvic pain.  She has no concern for STD exposure.  No indication for pelvic exam at this time.  Suspect appendicitis versus urinary process. At shift change, [facility] for continued evaluation, follow up of CTAP and determination of disposition.     Final Clinical Impressions(s) / ED Diagnoses   Final diagnoses:  Right lower quadrant abdominal  pain    ED Discharge Orders    None       Verdis PrimeLaw, Treyson Axel M, PA-C 09/03/18 2253    Benjiman CorePickering, Nathan, MD 09/03/18 2324

## 2018-09-03 NOTE — ED Provider Notes (Signed)
Care assumed from Glenford BayleyAlex Law at shift change with CT pending.  Please see her notes for further details  In brief, this patient is a 22 year old female presenting with 2-day history of right lower quadrant abdominal pain with associated nausea, vomiting and diarrhea.  She also did report that she had associated fever of 104.2 at home today which she took Tylenol prior to arrival.  Patient did report urinary symptoms that began today as well.  Her last p.o. intake was at 1230 today.    PLAN: CT scan. No indication for pelvic at this time. NPO since 1230. R/o Appendicitis vs pyelo.   Gen: afebrile, VSS (tachycardia improved) HEENT: Atraumatic, EOMI Resp: no resp distress CV: RRR Abd: soft, NT, ND. + left CVA tenderness.  MsK: moving all extremities well Neuro: A&O [phone]  MDM:  22 year old female presenting with lower abdominal pain, nausea, vomiting as well as flank pain.  On my assessment patient is complaining of suprapubic abdominal pain as well as left flank pain.  She reports that her pain was in the right lower quadrant on initial assessment but after pain medication this is resolved.  Patient does have positive CVA tenderness.  Her urine does show signs of infection.  She reported fever at home.  She is afebrile here.  CT scan reviewed and shows no evidence of appendicitis.  Patient does have CT scan findings that is suspicious for pyelonephritis.  Suspect pyelonephritis. Patient does have a penicillin allergy.  Urine culture was sent. She is tolerating PO fluids here. VSS. Will tx with bactrim. I advised the patient to follow-up with pcp this week. Specific return precautions discussed. Time was given for all questions to be answered. The patient verbalized understanding and agreement with plan. The patient appears safe for discharge home.  1. Pyelonephritis   2. Right lower quadrant abdominal pain       Princella PellegriniMaczis, Deseray Daponte M, PA-C 09/04/18 0103    Paula LibraMolpus, John, MD 09/04/18 [phone]

## 2018-09-03 NOTE — ED Triage Notes (Signed)
Patient c/o RLQ pain with N/V x3 days. Reports taking tylenol PTA for fever 104.2. Afebrile in triage. Denies diarrhea. [facility]

## 2018-09-04 MED ORDER — FLUCONAZOLE 150 MG PO TABS: 150.0000 mg | ORAL_TABLET | Freq: Once | ORAL | 0 refills | Status: AC

## 2018-09-04 MED ORDER — ONDANSETRON HCL 4 MG PO TABS: 4.0000 mg | ORAL_TABLET | Freq: Four times a day (QID) | ORAL | 0 refills | Status: AC

## 2018-09-04 MED ORDER — SULFAMETHOXAZOLE-TRIMETHOPRIM 800-160 MG PO TABS: 1.0000 | ORAL_TABLET | Freq: Two times a day (BID) | ORAL | 0 refills | Status: DC

## 2018-09-04 MED ORDER — NAPROXEN 500 MG PO TABS: 500.0000 mg | ORAL_TABLET | Freq: Two times a day (BID) | ORAL | 0 refills | Status: DC

## 2018-09-04 NOTE — ED Notes (Signed)
Pt transported to CT ?

## 2018-09-04 NOTE — Discharge Instructions (Signed)
Your CT scan and lab work were consistent with a kidney infection also called pyelonephritis.  I attached a handout on this.   Please take all of your antibiotics until finished!   You may develop abdominal discomfort or diarrhea from the antibiotic.  You may help offset this with probiotics which you can buy or get in yogurt. Do not eat or take the probiotics until 2 hours after your antibiotic. Do not take your medicine if develop an itchy rash, swelling in your mouth or lips, or difficulty breathing.  Please take zofran as needed for nausea.  Take Naproxen as directed for fever and pain.  Follow up with your pcp this week.   SEEK IMMEDIATE MEDICAL CARE IF:  The pain does not go away.  You have a fever >101 that does not go down with medication. You keep throwing up (vomiting) or cannot drink liquids.  You have shaking chills (rigors) The pain becomes localized (Pain in the right side could possibly be appendicitis. In an adult, pain in the left lower portion of the abdomen could be colitis or diverticulitis). You pass bloody or black tarry stools.  There is bright red blood in the stool.  There is blood in your vomit. Your bowel movements stop (become blocked) or you cannot pass gas.  The constipation stays for more than 4 days.  You have bloody, frequent, or painful urination.  You have yellow discoloration in the skin or whites of the eyes.  Your stomach becomes bloated or bigger.  You have dizziness or fainting.  You have chest or back pain. You have rectal pain.  You do not seem to be getting better.  You have any questions or concerns.   Your vital signs today were: BP 139/74 (BP Location: Left Arm)    Pulse (!) 101    Temp 98.8 F (37.1 C) (Oral)    Resp (!) 22    LMP 08/27/2018    SpO2 98%  If your blood pressure (bp) was elevated above 135/85 this visit, please have this repeated by your doctor within one month.

## 2018-09-05 ENCOUNTER — Emergency Department (HOSPITAL_BASED_OUTPATIENT_CLINIC_OR_DEPARTMENT_OTHER)
Admission: EM | Admit: 2018-09-05 | Discharge: 2018-09-06 | Disposition: A | Discharge status: Home / Self Care | Attending: Emergency Medicine | Admitting: Emergency Medicine

## 2018-09-05 ENCOUNTER — Encounter (HOSPITAL_BASED_OUTPATIENT_CLINIC_OR_DEPARTMENT_OTHER): Payer: Self-pay | Admitting: *Deleted

## 2018-09-05 ENCOUNTER — Other Ambulatory Visit: Payer: Self-pay

## 2018-09-05 DIAGNOSIS — N12 Tubulo-interstitial nephritis, not specified as acute or chronic: Secondary | ICD-10-CM | POA: Insufficient documentation

## 2018-09-05 DIAGNOSIS — J45909 Unspecified asthma, uncomplicated: Secondary | ICD-10-CM | POA: Insufficient documentation

## 2018-09-05 DIAGNOSIS — Z79899 Other long term (current) drug therapy: Secondary | ICD-10-CM | POA: Insufficient documentation

## 2018-09-05 LAB — URINALYSIS, MICROSCOPIC (REFLEX)

## 2018-09-05 LAB — CBC WITH DIFFERENTIAL/PLATELET
ABS IMMATURE GRANULOCYTES: 0.05 10*3/uL (ref 0.00–0.07)
BASOS PCT: 1 %
Basophils Absolute: 0.1 10*3/uL (ref 0.0–0.1)
Eosinophils Absolute: 0.1 10*3/uL (ref 0.0–0.5)
Eosinophils Relative: 1 %
HCT: 39.8 % (ref 36.0–46.0)
HEMOGLOBIN: 13.2 g/dL (ref 12.0–15.0)
IMMATURE GRANULOCYTES: 1 %
LYMPHS PCT: 12 %
Lymphs Abs: 1.3 10*3/uL (ref 0.7–4.0)
MCH: 27 pg (ref 26.0–34.0)
MCHC: 33.2 g/dL (ref 30.0–36.0)
MCV: 81.4 fL (ref 80.0–100.0)
Monocytes Absolute: 1.4 10*3/uL — ABNORMAL HIGH (ref 0.1–1.0)
Monocytes Relative: 13 %
Neutro Abs: 8.1 10*3/uL — ABNORMAL HIGH (ref 1.7–7.7)
Neutrophils Relative %: 72 %
PLATELETS: 204 10*3/uL (ref 150–400)
RBC: 4.89 MIL/uL (ref 3.87–5.11)
RDW: 14.1 % (ref 11.5–15.5)
WBC: 10.8 10*3/uL — ABNORMAL HIGH (ref 4.0–10.5)
nRBC: 0 % (ref 0.0–0.2)

## 2018-09-05 LAB — COMPREHENSIVE METABOLIC PANEL
ALBUMIN: 3.4 g/dL — AB (ref 3.5–5.0)
ALT: 55 U/L — AB (ref 0–44)
AST: 41 U/L (ref 15–41)
Alkaline Phosphatase: 79 U/L (ref 38–126)
Anion gap: 12 (ref 5–15)
BUN: 9 mg/dL (ref 6–20)
CHLORIDE: 100 mmol/L (ref 98–111)
CO2: 20 mmol/L — AB (ref 22–32)
Calcium: 8.8 mg/dL — ABNORMAL LOW (ref 8.9–10.3)
Creatinine, Ser: 1 mg/dL (ref 0.44–1.00)
GFR calc Af Amer: >60 mL/min (ref >60)
GLUCOSE: 98 mg/dL (ref 70–99)
Potassium: 3.4 mmol/L — ABNORMAL LOW (ref 3.5–5.1)
SODIUM: 132 mmol/L — AB (ref 135–145)
Total Bilirubin: 1.3 mg/dL — ABNORMAL HIGH (ref 0.3–1.2)
Total Protein: 7.3 g/dL (ref 6.5–8.1)

## 2018-09-05 LAB — URINALYSIS, ROUTINE W REFLEX MICROSCOPIC
Glucose, UA: NEGATIVE mg/dL
Ketones, ur: >80 mg/dL — AB
Nitrite: NEGATIVE
PROTEIN: NEGATIVE mg/dL
SPECIFIC GRAVITY, URINE: 1.01 (ref 1.005–1.030)
pH: 6 (ref 5.0–8.0)

## 2018-09-05 NOTE — ED Notes (Signed)
Pt unable to give urine sample at this time 

## 2018-09-05 NOTE — ED Triage Notes (Signed)
Pt states she has had frequent urination, n/v and body aches since Monday. [facility], told she had a kidney infection and not getting any better. She has had only 2 doses of antibiotics that were prescribed. She says she has had a fever 104.3 around 1730  and took tylenol tonight but does not know the time.

## 2018-09-05 NOTE — ED Provider Notes (Signed)
[facility] Provider Note: Lowella Dell, MD, FACEP  CSN: [id number] MRN: [id number] ARRIVAL: 09/05/18 at 2029 ROOM: MH06/MH06   CHIEF COMPLAINT  Fever   HISTORY OF PRESENT ILLNESS  09/05/18 11:51 PM Savannah Baldwin is a 22 y.o. female with abdominal pain, frequent urination, nausea, vomiting, body aches and fever for the past 5 days.  Her fever has been as high as 104.3 yesterday afternoon and she took Tylenol with relief.  She was seen at [facility] 2 days ago and diagnosed with pyelonephritis.  She was prescribed trimethoprim/sulfamethoxazole but has only taken 2 doses.  She returns with persistent fever and left flank pain.  She rates her pain as an 8 out of 10, worse with movement or palpation.  She is also having less severe suprapubic pain.   Past Medical History:  Diagnosis Date  . Allergy   . Asthma     History reviewed. No pertinent surgical history.  Family History  Problem Relation Age of Onset  . Hypertension Mother   . Hyperlipidemia Father     Social History   Tobacco Use  . Smoking status: Never Smoker  . Smokeless tobacco: Never Used  Substance Use Topics  . Alcohol use: Yes  . Drug use: Not on file    Prior to Admission medications   Medication Sig Start Date End Date Taking? Authorizing Provider  acetaminophen (TYLENOL) 160 MG/5ML liquid Take 500 mg by mouth every 4 (four) hours as needed for fever.     [provider]  albuterol (PROVENTIL HFA;VENTOLIN HFA) 108 (90 Base) MCG/ACT inhaler Inhale 1-2 puffs into the lungs every 6 (six) hours as needed for wheezing or shortness of breath. 11/05/17   Aviva Kluver B, PA-C  benzonatate (TESSALON) 100 MG capsule Take 1 capsule (100 mg total) by mouth every 8 (eight) hours. Patient not taking: Reported on 09/03/2018 11/05/17   Aviva Kluver B, PA-C  esomeprazole (NEXIUM) 40 MG capsule Take 40 mg by mouth daily as needed for heartburn.    [provider]  Etonogestrel  (NEXPLANON [place]) Inject into the skin.    [provider]  naproxen (NAPROSYN) 500 MG tablet Take 1 tablet (500 mg total) by mouth 2 (two) times daily. 09/04/18   Maczis, Elmer Sow, PA-C  naproxen sodium (ALEVE) 220 MG tablet Take 440 mg by mouth daily as needed (pain).    [provider]  ondansetron (ZOFRAN) 4 MG tablet Take 1 tablet (4 mg total) by mouth every 6 (six) hours. 09/04/18   Maczis, Elmer Sow, PA-C  potassium chloride SA (K-DUR,KLOR-CON) 20 MEQ tablet Take 1 tablet (20 mEq total) by mouth daily. Patient not taking: Reported on 09/03/2018 11/05/17   Elisha Ponder, PA-C  promethazine-codeine (PHENERGAN WITH CODEINE) 6.25-10 MG/5ML syrup Take 5-10 mLs by mouth every 6 (six) hours as needed. Patient not taking: Reported on 09/03/2018 01/08/14   Carmelina Dane, MD  sulfamethoxazole-trimethoprim (BACTRIM DS,SEPTRA DS) 800-160 MG tablet Take 1 tablet by mouth 2 (two) times daily for 14 days. 09/04/18 09/18/18  Maczis, Elmer Sow, PA-C    Allergies Penicillins   REVIEW OF SYSTEMS  Negative except as noted here or in the History of Present Illness.   PHYSICAL EXAMINATION  Initial Vital Signs Blood pressure 117/64, pulse 72, temperature 99.7 F (37.6 C), resp. rate 20, height 5\' 4"  (1.626 m), weight 81.6 kg, last menstrual period 08/27/2018, SpO2 98 %.  Examination General: Well-developed, well-nourished female in no acute distress; appearance consistent with age  of record HENT: normocephalic; atraumatic Eyes: pupils equal, round and reactive to light; extraocular muscles intact Neck: supple Heart: regular rate and rhythm Lungs: clear to auscultation bilaterally Abdomen: soft; nondistended; suprapubic tenderness bowel sounds present GU: Left CVA tenderness Extremities: No deformity; full range of motion; pulses normal Neurologic: Awake, alert and oriented; motor function intact in all extremities and symmetric; no facial droop Skin: Warm and  dry Psychiatric: Flat affect   RESULTS  Summary of this visit's results, reviewed by myself:   EKG Interpretation  Date/Time:    Ventricular Rate:    PR Interval:    QRS Duration:   QT Interval:    QTC Calculation:   R Axis:     Text Interpretation:        Laboratory Studies: Results for orders placed or performed during the hospital encounter of 09/05/18 (from the past 24 hour(s))  Comprehensive metabolic panel     Status: Abnormal   Collection Time: 09/05/18  8:43 PM  Result Value Ref Range   Sodium 132 (L) 135 - 145 mmol/L   Potassium 3.4 (L) 3.5 - 5.1 mmol/L   Chloride 100 98 - 111 mmol/L   CO2 20 (L) 22 - 32 mmol/L   Glucose, Bld 98 70 - 99 mg/dL   BUN 9 6 - 20 mg/dL   Creatinine, Ser 1.61 0.44 - 1.00 mg/dL   Calcium 8.8 (L) 8.9 - 10.3 mg/dL   Total Protein 7.3 6.5 - 8.1 g/dL   Albumin 3.4 (L) 3.5 - 5.0 g/dL   AST 41 15 - 41 U/L   ALT 55 (H) 0 - 44 U/L   Alkaline Phosphatase 79 38 - 126 U/L   Total Bilirubin 1.3 (H) 0.3 - 1.2 mg/dL   GFR calc non Af Amer >60 >60 mL/min   GFR calc Af Amer >60 >60 mL/min   Anion gap 12 5 - 15  CBC with Differential     Status: Abnormal   Collection Time: 09/05/18  8:43 PM  Result Value Ref Range   WBC 10.8 (H) 4.0 - 10.5 K/uL   RBC 4.89 3.87 - 5.11 MIL/uL   Hemoglobin 13.2 12.0 - 15.0 g/dL   HCT 09.6 04.5 - 40.9 %   MCV 81.4 80.0 - 100.0 fL   MCH 27.0 26.0 - 34.0 pg   MCHC 33.2 30.0 - 36.0 g/dL   RDW 81.1 91.4 - 78.2 %   Platelets 204 150 - 400 K/uL   nRBC 0.0 0.0 - 0.2 %   Neutrophils Relative % 72 %   Neutro Abs 8.1 (H) 1.7 - 7.7 K/uL   Lymphocytes Relative 12 %   Lymphs Abs 1.3 0.7 - 4.0 K/uL   Monocytes Relative 13 %   Monocytes Absolute 1.4 (H) 0.1 - 1.0 K/uL   Eosinophils Relative 1 %   Eosinophils Absolute 0.1 0.0 - 0.5 K/uL   Basophils Relative 1 %   Basophils Absolute 0.1 0.0 - 0.1 K/uL   Immature Granulocytes 1 %   Abs Immature Granulocytes 0.05 0.00 - 0.07 K/uL  Urinalysis, Routine w reflex microscopic      Status: Abnormal   Collection Time: 09/05/18 11:05 PM  Result Value Ref Range   Color, Urine YELLOW YELLOW   APPearance CLOUDY (A) CLEAR   Specific Gravity, Urine 1.010 1.005 - 1.030   pH 6.0 5.0 - 8.0   Glucose, UA NEGATIVE NEGATIVE mg/dL   Hgb urine dipstick SMALL (A) NEGATIVE   Bilirubin Urine SMALL (A) NEGATIVE  Ketones, ur >80 (A) NEGATIVE mg/dL   Protein, ur NEGATIVE NEGATIVE mg/dL   Nitrite NEGATIVE NEGATIVE   Leukocytes, UA MODERATE (A) NEGATIVE  Urinalysis, Microscopic (reflex)     Status: Abnormal   Collection Time: 09/05/18 11:05 PM  Result Value Ref Range   RBC / HPF 6-10 0 - 5 RBC/hpf   WBC, UA 21-50 0 - 5 WBC/hpf   Bacteria, UA FEW (A) NONE SEEN   Squamous Epithelial / LPF 0-5 0 - 5   Imaging Studies: No results found.  ED COURSE and MDM  Nursing notes and initial vitals signs, including pulse oximetry, reviewed.  Vitals:   09/05/18 2034 09/05/18 2244 09/06/18 0022  BP: 131/81 117/64 125/72  Pulse: 100 72 88  Resp: 20 20 16   Temp: (!) 100.4 F (38 C) 99.7 F (37.6 C) 99.6 F (37.6 C)  TempSrc: Oral  Oral  SpO2:  98% 100%  Weight: 81.6 kg    Height: 5\' 4"  (1.626 m)     1:00 AM Patient feeling better after IV Toradol.  Rocephin 1 g given for pyelonephritis.  She was advised to continue taking her antibiotic.  Culture shows E. coli but sensitivity is pending.  PROCEDURES    ED DIAGNOSES     ICD-10-CM   1. Pyelonephritis N12        Seferino Oscar, MD 09/06/18 [phone]

## 2018-09-06 LAB — URINE CULTURE: Culture: >=100000 — AB

## 2018-09-06 MED ORDER — FENTANYL CITRATE (PF) 100 MCG/2ML IJ SOLN
50.0000 ug | Freq: Once | INTRAMUSCULAR | Status: AC
  Administered 2018-09-06: 50 ug via INTRAVENOUS
  Filled 2018-09-06: qty 2

## 2018-09-06 MED ORDER — ONDANSETRON HCL 4 MG/2ML IJ SOLN
4.0000 mg | Freq: Once | INTRAMUSCULAR | Status: AC
  Administered 2018-09-06: 4 mg via INTRAVENOUS
  Filled 2018-09-06: qty 2

## 2018-09-06 MED ORDER — IBUPROFEN 800 MG PO TABS: 800.0000 mg | ORAL_TABLET | Freq: Three times a day (TID) | ORAL | 0 refills | Status: DC | PRN

## 2018-09-06 MED ORDER — SODIUM CHLORIDE 0.9 % IV SOLN
1.0000 g | Freq: Once | INTRAVENOUS | Status: AC
  Administered 2018-09-06: 1 g via INTRAVENOUS
  Filled 2018-09-06: qty 10

## 2018-09-07 ENCOUNTER — Telehealth: Payer: Self-pay

## 2018-09-07 NOTE — Telephone Encounter (Signed)
Post ED Visit - Positive Culture Follow-up: Successful Patient Follow-Up  Culture assessed and recommendations reviewed by:  []  Enzo BiNathan Batchelder, Pharm.D. []  Celedonio MiyamotoJeremy Frens, Pharm.D., BCPS AQ-ID []  Garvin FilaMike Maccia, Pharm.D., BCPS []  Georgina PillionElizabeth Martin, [address] BoulevardPharm.D., BCPS []  TerrytownMinh Pham, Vermont[place]arm.D., BCPS, AAHIVP []  Estella HuskMichelle Turner, Pharm.D., BCPS, AAHIVP []  Lysle Pearlachel Rumbarger, PharmD, BCPS []  Phillips Climeshuy Dang, PharmD, BCPS [x]  Agapito GamesAlison Masters, PharmD, BCPS []  Verlan FriendsErin Deja, PharmD  Positive urine culture  []  Patient discharged without antimicrobial prescription and treatment is now indicated [x]  Organism is resistant to prescribed ED discharge antimicrobial []  Patient with positive blood cultures  Changes discussed with ED provider: Jodi GeraldsKelsey Ford [facility] New antibiotic prescription Keflex 500 mg BID x 7 days [facility] [phone]  Contacted patient, date 09/07/18, time 0935   [name], [name] 09/07/2018, 9:33 AM

## 2018-09-09 ENCOUNTER — Ambulatory Visit: Payer: Self-pay | Attending: Internal Medicine | Admitting: Internal Medicine

## 2018-09-09 ENCOUNTER — Encounter: Payer: Self-pay | Admitting: Internal Medicine

## 2018-09-09 VITALS — BP 116/81 | HR 74 | Temp 98.3°F | Resp 16 | Ht 64.0 in | Wt 181.4 lb

## 2018-09-09 DIAGNOSIS — Z79899 Other long term (current) drug therapy: Secondary | ICD-10-CM | POA: Insufficient documentation

## 2018-09-09 DIAGNOSIS — Z880 Allergy status to penicillin: Secondary | ICD-10-CM | POA: Insufficient documentation

## 2018-09-09 DIAGNOSIS — N12 Tubulo-interstitial nephritis, not specified as acute or chronic: Secondary | ICD-10-CM

## 2018-09-09 DIAGNOSIS — J4520 Mild intermittent asthma, uncomplicated: Secondary | ICD-10-CM | POA: Insufficient documentation

## 2018-09-09 DIAGNOSIS — N10 Acute tubulo-interstitial nephritis: Secondary | ICD-10-CM | POA: Insufficient documentation

## 2018-09-09 DIAGNOSIS — Z23 Encounter for immunization: Secondary | ICD-10-CM | POA: Insufficient documentation

## 2018-09-09 DIAGNOSIS — Z833 Family history of diabetes mellitus: Secondary | ICD-10-CM | POA: Insufficient documentation

## 2018-09-09 MED ORDER — ALBUTEROL SULFATE HFA 108 (90 BASE) MCG/ACT IN AERS: 1.0000 | INHALATION_SPRAY | Freq: Four times a day (QID) | RESPIRATORY_TRACT | 6 refills | Status: AC | PRN

## 2018-09-09 MED ORDER — IBUPROFEN 800 MG PO TABS: 800.0000 mg | ORAL_TABLET | Freq: Three times a day (TID) | ORAL | 0 refills | Status: AC | PRN

## 2018-09-09 MED FILL — IBUPROFEN 800 MG TABLET: 800 | 10 days supply | Qty: 30 | Fill #0

## 2018-09-09 MED FILL — ALBUTEROL SULFATE HFA 108 (: 108 (90 BAS | 25 days supply | Qty: 18 | Fill #0

## 2018-09-09 NOTE — Progress Notes (Signed)
Patient ID: Savannah Baldwin, female    DOB: 1996-02-17  MRN: [id number]  CC: Hospitalization Follow-up (ED f/u)   Subjective: Savannah Baldwin is a 22 y.o. female who presents for new pt visit and ER f/u.  Her mother,Savannah Baldwin, is with her.  Her concerns today include:  Patient with history with mild intermittent asthma  Patient seen in the ED x2 in the past week.  She was diagnosed with acute pyelonephritis.  She was placed on Bactrim.  Urine culture grew E. coli that was resistant to Bactrim.  This was subsequently changed to Keflex. Has about 4 days left. Feeling better, no further fever.   Asthma: never hosp for asthma.  Uses albuterol about 2 x a wk. Flares with extreme temp changes. She does not smoke.  Gets pneumonia about once a year and wonders whether she needs a pneumonia vaccine.  She reportedly was given Pneumovax about 2 years ago.    Current Outpatient Medications on File Prior to Visit  Medication Sig Dispense Refill  . esomeprazole (NEXIUM) 40 MG capsule Take 40 mg by mouth daily as needed for heartburn.    . Etonogestrel (NEXPLANON [place]) Inject into the skin.    Marland Kitchen. acetaminophen (TYLENOL) 160 MG/5ML liquid Take 500 mg by mouth every 4 (four) hours as needed for fever.     . ondansetron (ZOFRAN) 4 MG tablet Take 1 tablet (4 mg total) by mouth every 6 (six) hours. (Patient not taking: Reported on 09/09/2018) 12 tablet 0   No current facility-administered medications on file prior to visit.     Allergies  Allergen Reactions  . Penicillins Rash    Has patient had a PCN reaction causing immediate rash, facial/tongue/throat swelling, SOB or lightheadedness with hypotension: Yes Has patient had a PCN reaction causing severe rash involving mucus membranes or skin necrosis: No Has patient had a PCN reaction that required hospitalization: No Has patient had a PCN reaction occurring within the last 10 years: No If all of the above answers are "NO", then may proceed with  Cephalosporin use.     Social History   Socioeconomic History  . Marital status: Single    Spouse name: Not on file  . Number of children: 0  . Years of education: BS  . Highest education level: Not on file  Occupational History  . Not on file  Social Needs  . Financial resource strain: Not on file  . Food insecurity:    Worry: Not on file    Inability: Not on file  . Transportation needs:    Medical: Not on file    Non-medical: Not on file  Tobacco Use  . Smoking status: Never Smoker  . Smokeless tobacco: Never Used  Substance and Sexual Activity  . Alcohol use: Yes    Comment: socially on weekends  . Drug use: Never  . Sexual activity: Not on file  Lifestyle  . Physical activity:    Days per week: Not on file    Minutes per session: Not on file  . Stress: Not on file  Relationships  . Social connections:    Talks on phone: Not on file    Gets together: Not on file    Attends religious service: Not on file    Active member of club or organization: Not on file    Attends meetings of clubs or organizations: Not on file    Relationship status: Not on file  . Intimate partner violence:    Fear  of current or ex partner: Not on file    Emotionally abused: Not on file    Physically abused: Not on file    Forced sexual activity: Not on file  Other Topics Concern  . Not on file  Social History Narrative  . Not on file    Family History  Problem Relation Age of Onset  . Hypertension Mother   . Diabetes Mother   . Hyperlipidemia Father   . Hypertension Father   . Throat cancer Maternal Uncle     Past Surgical History:  Procedure Laterality Date  . TOOTH EXTRACTION      ROS: Review of Systems Neg except as above PHYSICAL EXAM: BP 116/81   Pulse 74   Temp 98.3 F (36.8 C) (Oral)   Resp 16   Ht 5\' 4"  (1.626 m)   Wt 181 lb 6.4 oz (82.3 kg)   LMP 08/27/2018   SpO2 98%   BMI 31.14 kg/m   Physical Exam  General appearance - alert, well appearing,  and in no distress Mental status - normal mood, behavior, speech, dress, motor activity, and thought processes Eyes - pupils equal and reactive, extraocular eye movements intact Neck - supple, no significant adenopathy Chest - clear to auscultation, no wheezes, rales or rhonchi, symmetric air entry Heart - normal rate, regular rhythm, normal S1, S2, no murmurs, rubs, clicks or gallops Abdomen - soft, nontender, nondistended, no masses or organomegaly.  Mild LT flank tenderness Extremities - peripheral pulses normal, no pedal edema, no clubbing or cyanosis   ASSESSMENT AND PLAN: 1. Pyelonephritis Clinically resolving and responding to antibiotics.  Advised to take the Keflex to completion. - ibuprofen (ADVIL,MOTRIN) 800 MG tablet; Take 1 tablet (800 mg total) by mouth every 8 (eight) hours as needed (for fever or pain).  Dispense: 30 tablet; Refill: 0  2. Mild intermittent asthma without complication She does not need another Pneumovax.  Advised that she will be due at the age of 81. - albuterol (PROVENTIL HFA;VENTOLIN HFA) 108 (90 Base) MCG/ACT inhaler; Inhale 1-2 puffs into the lungs every 6 (six) hours as needed for wheezing or shortness of breath.  Dispense: 1 Inhaler; Refill: 6  3. Need for influenza vaccination Given today.  Patient was given the opportunity to ask questions.  Patient verbalized understanding of the plan and was able to repeat key elements of the plan.   No orders of the defined types were placed in this encounter.    Requested Prescriptions   Signed Prescriptions Disp Refills  . albuterol (PROVENTIL HFA;VENTOLIN HFA) 108 (90 Base) MCG/ACT inhaler 1 Inhaler 6    Sig: Inhale 1-2 puffs into the lungs every 6 (six) hours as needed for wheezing or shortness of breath.  Marland Kitchen ibuprofen (ADVIL,MOTRIN) 800 MG tablet 30 tablet 0    Sig: Take 1 tablet (800 mg total) by mouth every 8 (eight) hours as needed (for fever or pain).    Return if symptoms worsen or fail to  improve.  Jonah Blue, MD, FACP

## 2018-09-09 NOTE — Patient Instructions (Addendum)
Influenza Virus Vaccine injection (Fluarix) What is this medicine? INFLUENZA VIRUS VACCINE (in floo EN zuh VAHY ruhs vak SEEN) helps to reduce the risk of getting influenza also known as the flu. This medicine may be used for other purposes; ask your health care provider or pharmacist if you have questions. COMMON BRAND NAME(S): Fluarix, Fluzone What should I tell my health care provider before I take this medicine? They need to know if you have any of these conditions: -bleeding disorder like hemophilia -fever or infection -Guillain-Barre syndrome or other neurological problems -immune system problems -infection with the human immunodeficiency virus (HIV) or AIDS -low blood platelet counts -multiple sclerosis -an unusual or allergic reaction to influenza virus vaccine, eggs, chicken proteins, latex, gentamicin, other medicines, foods, dyes or preservatives -pregnant or trying to get pregnant -breast-feeding How should I use this medicine? This vaccine is for injection into a muscle. It is given by a health care professional. A copy of Vaccine Information Statements will be given before each vaccination. Read this sheet carefully each time. The sheet may change frequently. Talk to your pediatrician regarding the use of this medicine in children. Special care may be needed. Overdosage: If you think you have taken too much of this medicine contact a poison control center or emergency room at once. NOTE: This medicine is only for you. Do not share this medicine with others. What if I miss a dose? This does not apply. What may interact with this medicine? -chemotherapy or radiation therapy -medicines that lower your immune system like etanercept, anakinra, infliximab, and adalimumab -medicines that treat or prevent blood clots like warfarin -phenytoin -steroid medicines like prednisone or cortisone -theophylline -vaccines This list may not describe all possible interactions. Give your  health care provider a list of all the medicines, herbs, non-prescription drugs, or dietary supplements you use. Also tell them if you smoke, drink alcohol, or use illegal drugs. Some items may interact with your medicine. What should I watch for while using this medicine? Report any side effects that do not go away within 3 days to your doctor or health care professional. Call your health care provider if any unusual symptoms occur within 6 weeks of receiving this vaccine. You may still catch the flu, but the illness is not usually as bad. You cannot get the flu from the vaccine. The vaccine will not protect against colds or other illnesses that may cause fever. The vaccine is needed every year. What side effects may I notice from receiving this medicine? Side effects that you should report to your doctor or health care professional as soon as possible: -allergic reactions like skin rash, itching or hives, swelling of the face, lips, or tongue Side effects that usually do not require medical attention (report to your doctor or health care professional if they continue or are bothersome): -fever -headache -muscle aches and pains -pain, tenderness, redness, or swelling at site where injected -weak or tired This list may not describe all possible side effects. Call your doctor for medical advice about side effects. You may report side effects to FDA at 1-800-FDA-1088. Where should I keep my medicine? This vaccine is only given in a clinic, pharmacy, doctor's office, or other health care setting and will not be stored at home. NOTE: This sheet is a summary. It may not cover all possible information. If you have questions about this medicine, talk to your doctor, pharmacist, or health care provider.  2018 Elsevier/Gold Standard (2008-04-28 09:30:40)  Pyelonephritis, Adult Pyelonephritis is  a kidney infection. The kidneys are the organs that filter a person's blood and move waste out of the  bloodstream and into the urine. Urine passes from the kidneys, through the ureters, and into the bladder. There are two main types of pyelonephritis:  Infections that come on quickly without any warning (acute pyelonephritis).  Infections that last for a long period of time (chronic pyelonephritis).  In most cases, the infection clears up with treatment and does not cause further problems. More severe infections or chronic infections can sometimes spread to the bloodstream or lead to other problems with the kidneys. What are the causes? This condition is usually caused by:  Bacteria traveling from the bladder to the kidney through infected urine. The urine in the bladder can become infected with bacteria from: ? Bladder infection (cystitis). ? Inflammation of the prostate gland (prostatitis). ? Sexual intercourse, in females.  Bacteria traveling from the bloodstream to the kidney.  What increases the risk? This condition is more likely to develop in:  Pregnant women.  Older people.  People who have diabetes.  People who have kidney stones or bladder stones.  People who have other abnormalities of the kidney or ureter.  People who have a catheter placed in the bladder.  People who have cancer.  People who are sexually active.  Women who use spermicides.  People who have had a prior urinary tract infection.  What are the signs or symptoms? Symptoms of this condition include:  Frequent urination.  Strong or persistent urge to urinate.  Burning or stinging when urinating.  Abdominal pain.  Back pain.  Pain in the side or flank area.  Fever.  Chills.  Blood in the urine, or dark urine.  Nausea.  Vomiting.  How is this diagnosed? This condition may be diagnosed based on:  Medical history and physical exam.  Urine tests.  Blood tests.  You may also have imaging tests of the kidneys, such as an ultrasound or CT scan. How is this treated? Treatment  for this condition may depend on the severity of the infection.  If the infection is mild and is found early, you may be treated with antibiotic medicines taken by mouth. You will need to drink fluids to remain hydrated.  If the infection is more severe, you may need to stay in the hospital and receive antibiotics given directly into a vein through an IV tube. You may also need to receive fluids through an IV tube if you are not able to remain hydrated. After your hospital stay, you may need to take oral antibiotics for a period of time.  Other treatments may be required, depending on the cause of the infection. Follow these instructions at home: Medicines  Take over-the-counter and prescription medicines only as told by your health care provider.  If you were prescribed an antibiotic medicine, take it as told by your health care provider. Do not stop taking the antibiotic even if you start to feel better. General instructions  Drink enough fluid to keep your urine clear or pale yellow.  Avoid caffeine, tea, and carbonated beverages. They tend to irritate the bladder.  Urinate often. Avoid holding in urine for long periods of time.  Urinate before and after sex.  After a bowel movement, women should cleanse from front to back. Use each tissue only once.  Keep all follow-up visits as told by your health care provider. This is important. Contact a health care provider if:  Your symptoms do not get  better after 2 days of treatment.  Your symptoms get worse.  You have a fever. Get help right away if:  You are unable to take your antibiotics or fluids.  You have shaking chills.  You vomit.  You have severe flank or back pain.  You have extreme weakness or fainting. This information is not intended to replace advice given to you by your health care provider. Make sure you discuss any questions you have with your health care provider. Document Released: 10/01/2005 Document  Revised: 03/08/2016 Document Reviewed: 01/24/2015 Elsevier Interactive Patient Education  [employer]2018 Elsevier Inc.

## 2018-09-10 ENCOUNTER — Ambulatory Visit: Admitting: Family Medicine

## 2018-09-17 ENCOUNTER — Ambulatory Visit: Payer: Self-pay

## 2019-03-06 IMAGING — DX DG CHEST 2V
2 series · 2 of 2 positions shown · non-contrast
Comparison: 01/08/2017

CLINICAL DATA: Cough and fever

EXAM:
CHEST  2 VIEW

[chest pa]
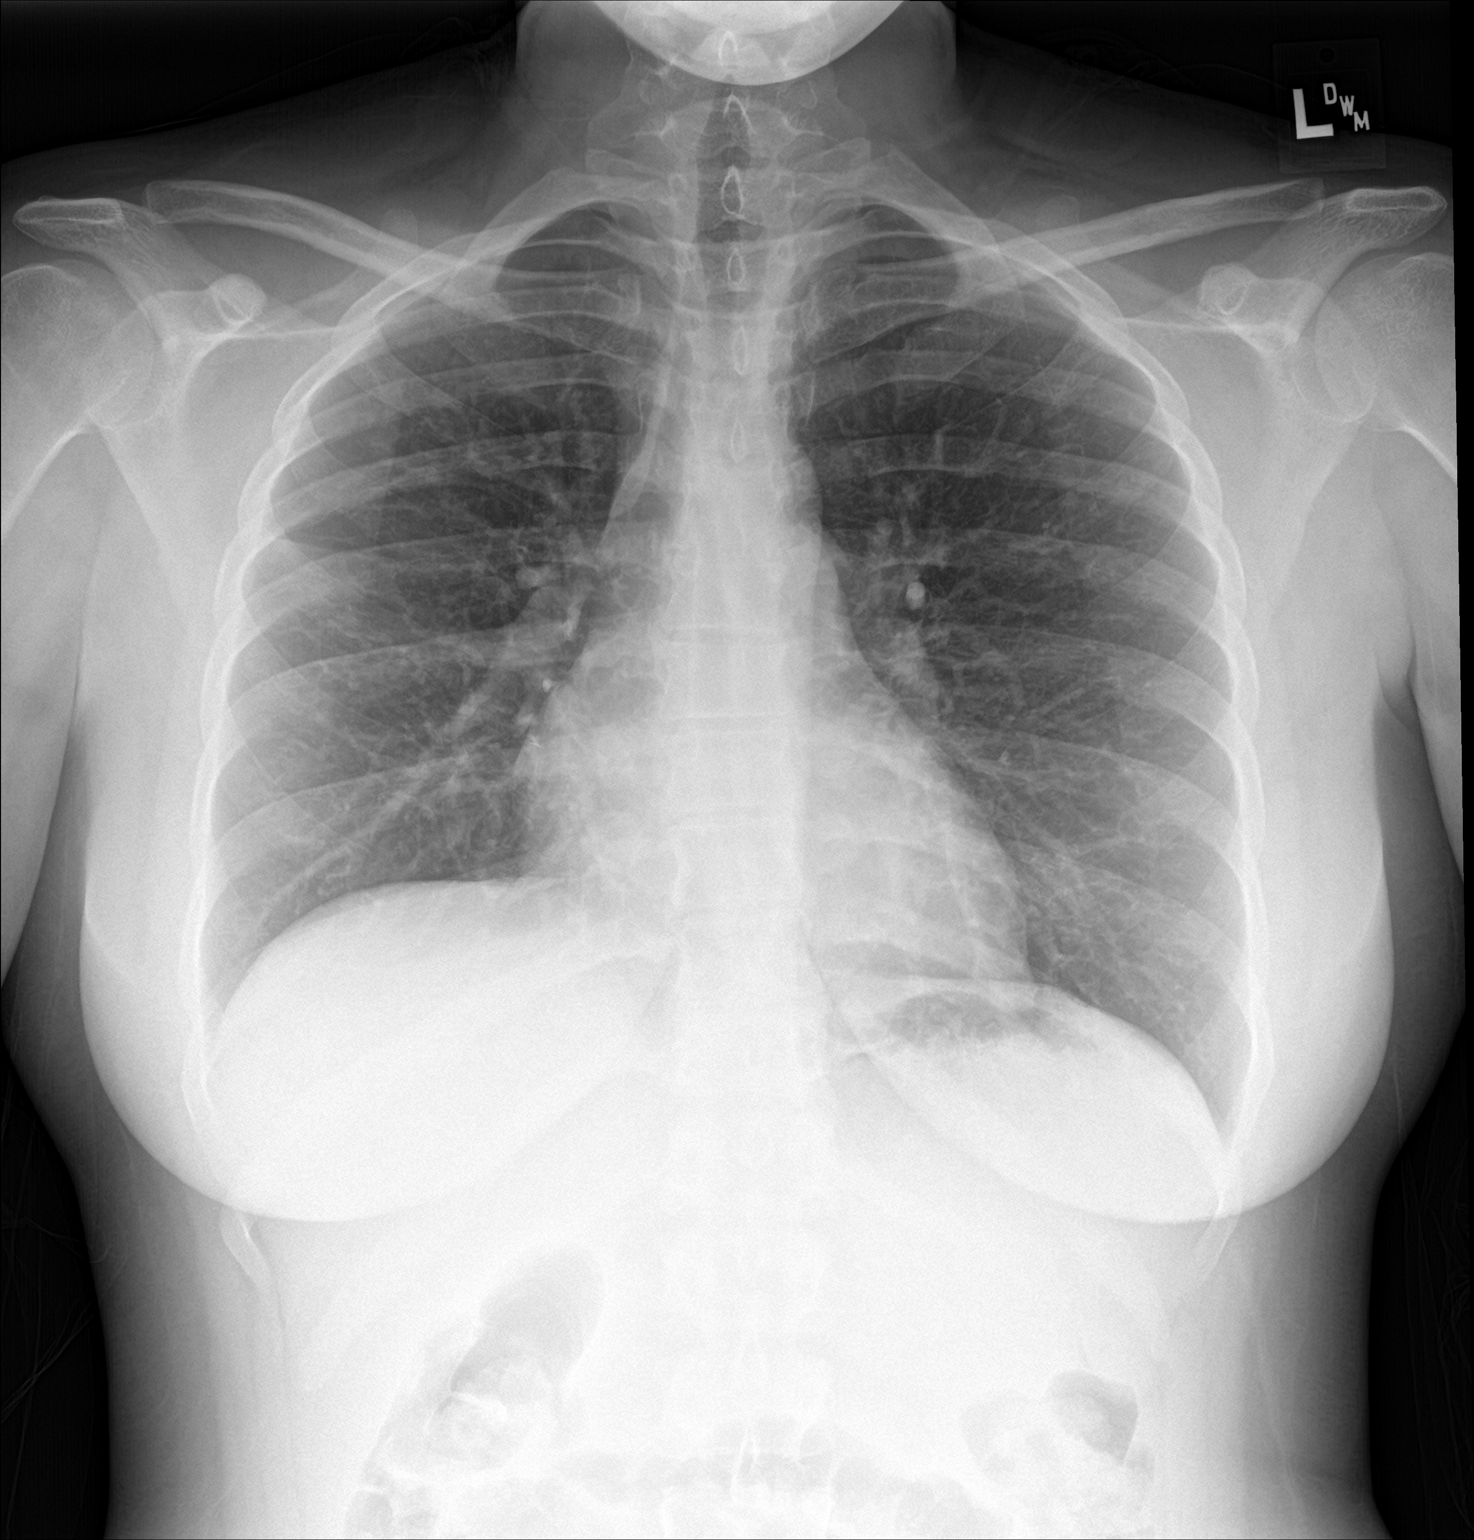

[chest lat]
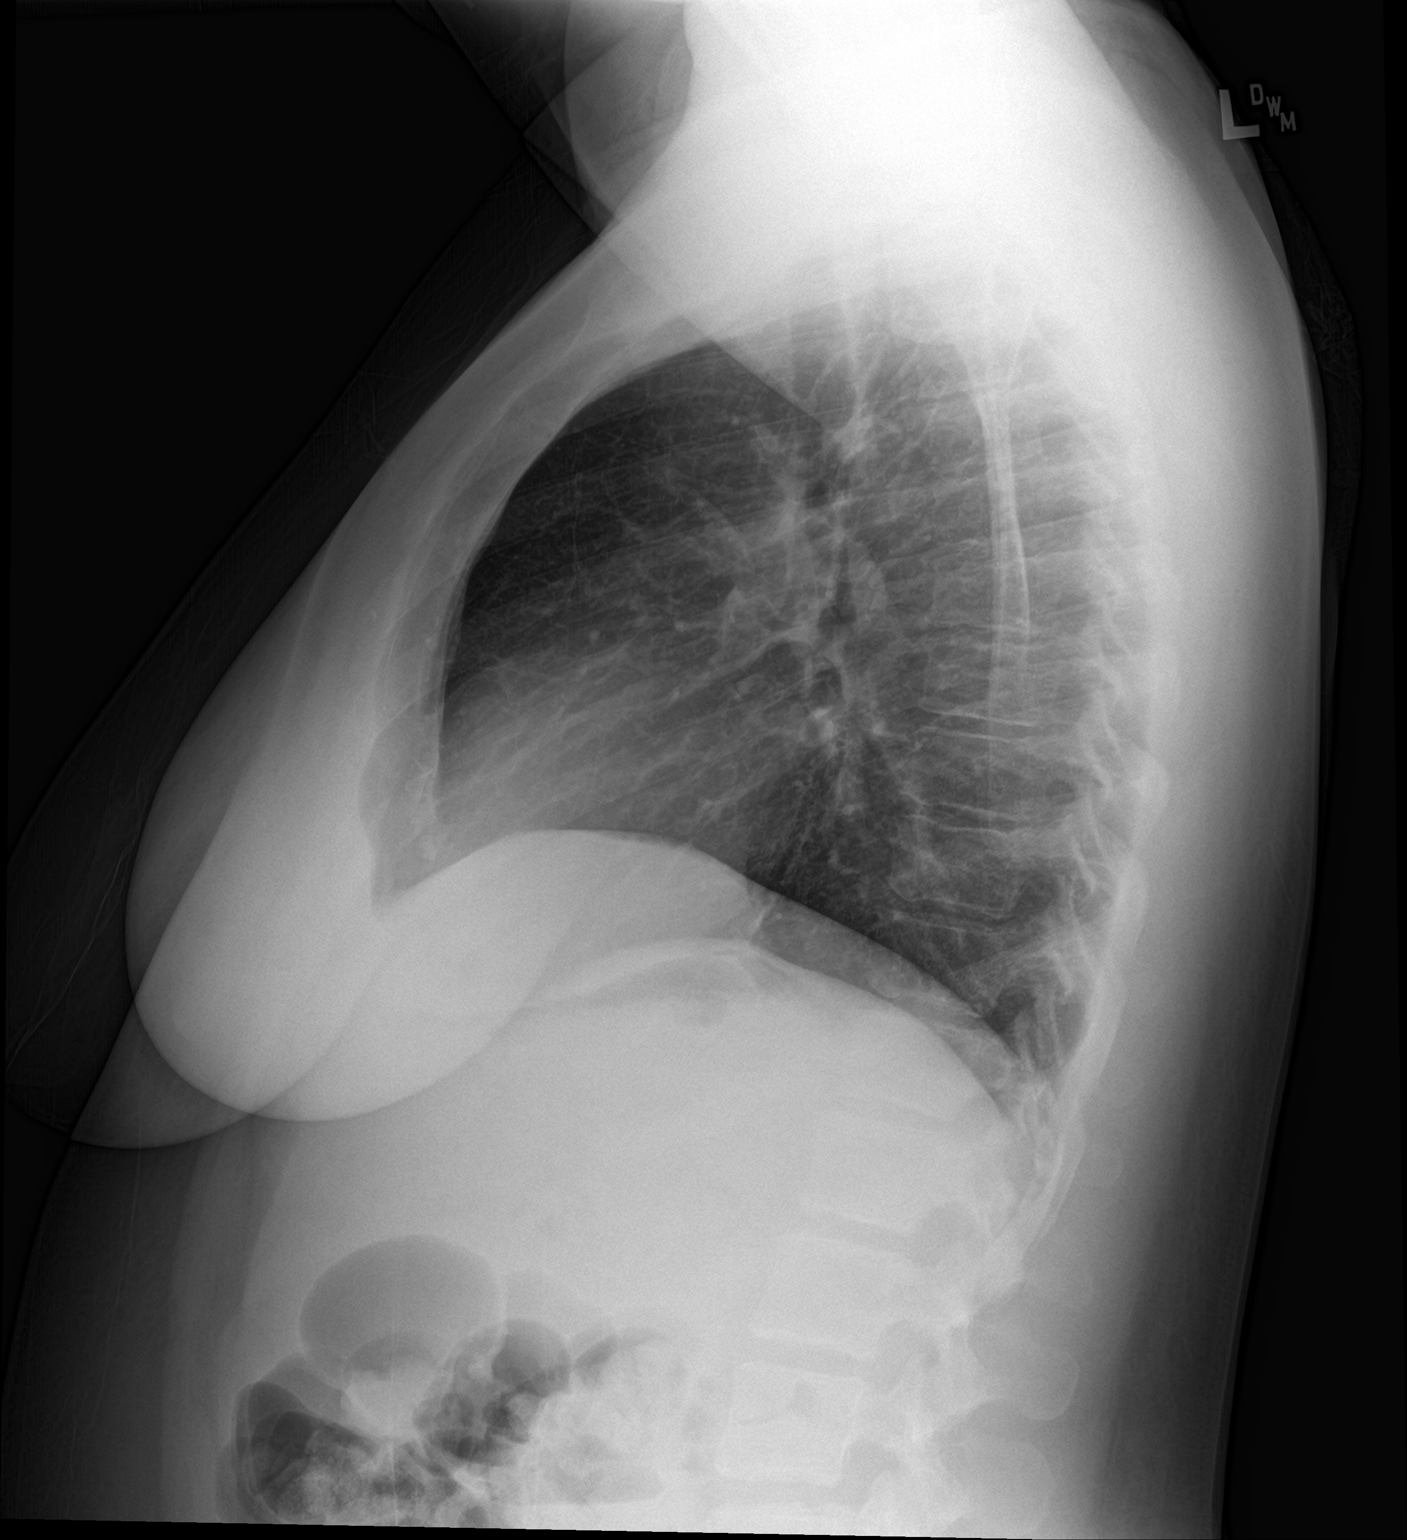

[2 of 2 positions shown; findings below may reference images not displayed]

FINDINGS: Mild bronchitic changes. No focal consolidation or effusion. Normal
heart size. No pneumothorax
IMPRESSION: Mild bronchitic changes.  No focal pulmonary infiltrate

## 2020-08-06 ENCOUNTER — Emergency Department (HOSPITAL_BASED_OUTPATIENT_CLINIC_OR_DEPARTMENT_OTHER): Payer: No Typology Code available for payment source

## 2020-08-06 ENCOUNTER — Emergency Department (HOSPITAL_COMMUNITY)
Admission: EM | Admit: 2020-08-06 | Discharge: 2020-08-06 | Disposition: A | Discharge status: Home / Self Care | Payer: No Typology Code available for payment source | Attending: Emergency Medicine | Admitting: Emergency Medicine

## 2020-08-06 ENCOUNTER — Encounter (HOSPITAL_COMMUNITY): Payer: Self-pay | Admitting: Emergency Medicine

## 2020-08-06 ENCOUNTER — Other Ambulatory Visit: Payer: Self-pay

## 2020-08-06 ENCOUNTER — Emergency Department (HOSPITAL_COMMUNITY): Payer: No Typology Code available for payment source

## 2020-08-06 DIAGNOSIS — Y9301 Activity, walking, marching and hiking: Secondary | ICD-10-CM | POA: Diagnosis not present

## 2020-08-06 DIAGNOSIS — J45909 Unspecified asthma, uncomplicated: Secondary | ICD-10-CM | POA: Diagnosis not present

## 2020-08-06 DIAGNOSIS — M25562 Pain in left knee: Secondary | ICD-10-CM | POA: Insufficient documentation

## 2020-08-06 DIAGNOSIS — Z79899 Other long term (current) drug therapy: Secondary | ICD-10-CM | POA: Insufficient documentation

## 2020-08-06 DIAGNOSIS — M79662 Pain in left lower leg: Secondary | ICD-10-CM

## 2020-08-06 DIAGNOSIS — M25561 Pain in right knee: Secondary | ICD-10-CM

## 2020-08-06 DIAGNOSIS — M7989 Other specified soft tissue disorders: Secondary | ICD-10-CM

## 2020-08-06 DIAGNOSIS — M79609 Pain in unspecified limb: Secondary | ICD-10-CM

## 2020-08-06 MED ORDER — NAPROXEN 500 MG PO TABS: 500.0000 mg | ORAL_TABLET | Freq: Two times a day (BID) | ORAL | 0 refills | Status: AC

## 2020-08-06 NOTE — Discharge Instructions (Addendum)
The ultrasound of your leg is normal. There is no evidence of blood clot. Please treat your symptoms as discussed.  At this time there does not appear to be the presence of an emergent medical condition, however there is always the potential for conditions to change. Please read and follow the below instructions.  Please return to the Emergency Department immediately for any new or worsening symptoms. Please be sure to follow up with your Primary Care Provider within one week regarding your visit today; please call their office to schedule an appointment even if you are feeling better for a follow-up visit. Please call Dr. Ophelia Charter to schedule follow-up appointment regarding your musculoskeletal pain.  Go to the nearest Emergency Department immediately if: You have fever or chills Your knee swells, and the swelling gets worse. You cannot move your knee. You have very bad knee pain. You have any new/concerning or worsening of symptoms  Please read the additional information packets attached to your discharge summary.  Do not take your medicine if  develop an itchy rash, swelling in your mouth or lips, or difficulty breathing; call 911 and seek immediate emergency medical attention if this occurs.  You may review your lab tests and imaging results in their entirety on your MyChart account.  Please discuss all results of fully with your primary care provider and other specialist at your follow-up visit.
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
## 2023-01-08 DIAGNOSIS — N760 Acute vaginitis: Secondary | ICD-10-CM | POA: Diagnosis not present

## 2023-01-08 DIAGNOSIS — Z124 Encounter for screening for malignant neoplasm of cervix: Secondary | ICD-10-CM | POA: Diagnosis not present

## 2023-01-08 DIAGNOSIS — Z01411 Encounter for gynecological examination (general) (routine) with abnormal findings: Secondary | ICD-10-CM | POA: Diagnosis not present

## 2023-01-08 DIAGNOSIS — N898 Other specified noninflammatory disorders of vagina: Secondary | ICD-10-CM | POA: Diagnosis not present

## 2023-01-08 DIAGNOSIS — Z118 Encounter for screening for other infectious and parasitic diseases: Secondary | ICD-10-CM | POA: Diagnosis not present

## 2023-01-08 DIAGNOSIS — A5900 Urogenital trichomoniasis, unspecified: Secondary | ICD-10-CM | POA: Diagnosis not present

## 2023-01-08 DIAGNOSIS — Z1159 Encounter for screening for other viral diseases: Secondary | ICD-10-CM | POA: Diagnosis not present

## 2023-01-08 DIAGNOSIS — Z114 Encounter for screening for human immunodeficiency virus [HIV]: Secondary | ICD-10-CM | POA: Diagnosis not present

## 2023-01-08 DIAGNOSIS — Z113 Encounter for screening for infections with a predominantly sexual mode of transmission: Secondary | ICD-10-CM | POA: Diagnosis not present

## 2023-07-11 DIAGNOSIS — Z113 Encounter for screening for infections with a predominantly sexual mode of transmission: Secondary | ICD-10-CM | POA: Diagnosis not present

## 2023-07-11 DIAGNOSIS — Z1159 Encounter for screening for other viral diseases: Secondary | ICD-10-CM | POA: Diagnosis not present

## 2023-07-11 DIAGNOSIS — N921 Excessive and frequent menstruation with irregular cycle: Secondary | ICD-10-CM | POA: Diagnosis not present

## 2023-07-11 DIAGNOSIS — R52 Pain, unspecified: Secondary | ICD-10-CM | POA: Diagnosis not present

## 2023-07-11 DIAGNOSIS — Z118 Encounter for screening for other infectious and parasitic diseases: Secondary | ICD-10-CM | POA: Diagnosis not present

## 2023-07-11 DIAGNOSIS — R102 Pelvic and perineal pain: Secondary | ICD-10-CM | POA: Diagnosis not present

## 2023-08-20 DIAGNOSIS — A5900 Urogenital trichomoniasis, unspecified: Secondary | ICD-10-CM | POA: Diagnosis not present

## 2023-08-20 DIAGNOSIS — N762 Acute vulvitis: Secondary | ICD-10-CM | POA: Diagnosis not present

## 2023-08-28 DIAGNOSIS — Z1159 Encounter for screening for other viral diseases: Secondary | ICD-10-CM | POA: Diagnosis not present

## 2024-01-22 ENCOUNTER — Encounter (HOSPITAL_BASED_OUTPATIENT_CLINIC_OR_DEPARTMENT_OTHER): Payer: Self-pay | Admitting: Emergency Medicine

## 2024-01-22 DIAGNOSIS — R059 Cough, unspecified: Secondary | ICD-10-CM | POA: Diagnosis not present

## 2024-01-22 DIAGNOSIS — U071 COVID-19: Secondary | ICD-10-CM | POA: Insufficient documentation

## 2024-01-22 NOTE — ED Triage Notes (Signed)
 Pt coming from home with COVID-like symptoms that started today. Pt states she woke up with fatigue, SOB, loss of taste, and chills

## 2024-01-23 ENCOUNTER — Emergency Department (HOSPITAL_BASED_OUTPATIENT_CLINIC_OR_DEPARTMENT_OTHER)
Admission: EM | Admit: 2024-01-23 | Discharge: 2024-01-23 | Disposition: A | Discharge status: Home / Self Care | Attending: Emergency Medicine | Admitting: Emergency Medicine

## 2024-01-23 DIAGNOSIS — U071 COVID-19: Secondary | ICD-10-CM

## 2024-01-23 LAB — RESP PANEL BY RT-PCR (RSV, FLU A&B, COVID)  RVPGX2
Influenza A by PCR: NEGATIVE
Influenza B by PCR: NEGATIVE
Resp Syncytial Virus by PCR: NEGATIVE
SARS Coronavirus 2 by RT PCR: POSITIVE — AB

## 2024-01-23 MED ORDER — KETOROLAC TROMETHAMINE 15 MG/ML IJ SOLN
15.0000 mg | Freq: Once | INTRAMUSCULAR | Status: AC
  Administered 2024-01-23: 15 mg via INTRAMUSCULAR
  Filled 2024-01-23: qty 1

## 2024-01-23 MED ORDER — BENZONATATE 100 MG PO CAPS: 100.0000 mg | ORAL_CAPSULE | Freq: Three times a day (TID) | ORAL | 0 refills | Status: AC

## 2024-01-23 MED ORDER — ONDANSETRON 4 MG PO TBDP: ORAL_TABLET | ORAL | 0 refills | Status: AC

## 2024-01-23 NOTE — Discharge Instructions (Signed)
 Take tylenol 2 pills 4 times a day and motrin 4 pills 3 times a day.  Drink plenty of fluids.  Return for worsening shortness of breath, headache, confusion. Follow up with your family doctor.

## 2024-01-23 NOTE — ED Provider Notes (Signed)
 [place] EMERGENCY DEPARTMENT AT [facility] Provider Note   CSN: [id number] Arrival date & time: 01/22/24  2216     History  Chief Complaint  Patient presents with   Influenza    Savannah Baldwin is a 28 y.o. female.  28 yo F with a chief complaint of cough congestion myalgias going on for about 12 to 24 hours.  She denies sick contacts but recently went to a music festival.  Denies nausea or vomiting.  Has been able to eat and drink but has not really felt like eating much.   Influenza      Home Medications Prior to Admission medications   Medication Sig Start Date End Date Taking? Authorizing Provider  benzonatate (TESSALON) 100 MG capsule Take 1 capsule (100 mg total) by mouth every 8 (eight) hours. 01/23/24  Yes Melene Plan, DO  ondansetron (ZOFRAN-ODT) 4 MG disintegrating tablet 4mg  ODT q4 hours prn nausea/vomit 01/23/24  Yes Melene Plan, DO  acetaminophen (TYLENOL) 160 MG/5ML liquid Take 500 mg by mouth every 4 (four) hours as needed for fever.     [provider]  albuterol (PROVENTIL HFA;VENTOLIN HFA) 108 (90 Base) MCG/ACT inhaler Inhale 1-2 puffs into the lungs every 6 (six) hours as needed for wheezing or shortness of breath. 09/09/18   Marcine Matar, MD  esomeprazole (NEXIUM) 40 MG capsule Take 40 mg by mouth daily as needed for heartburn.    [provider]  Etonogestrel (NEXPLANON [place]) Inject into the skin.    [provider]  ibuprofen (ADVIL,MOTRIN) 800 MG tablet Take 1 tablet (800 mg total) by mouth every 8 (eight) hours as needed (for fever or pain). 09/09/18   Marcine Matar, MD  naproxen (NAPROSYN) 500 MG tablet Take 1 tablet (500 mg total) by mouth 2 (two) times daily with a meal. 08/06/20   Robinson, [place] N, PA-C  ondansetron (ZOFRAN) 4 MG tablet Take 1 tablet (4 mg total) by mouth every 6 (six) hours. Patient not taking: Reported on 09/09/2018 09/04/18   Jacinto Halim, PA-C      Allergies    Penicillins     Review of Systems   Review of Systems  Physical Exam Updated Vital Signs BP (!) 151/101   Pulse (!) 58   Temp 99 F (37.2 C) (Temporal)   Ht 5\' 4"  (1.626 m)   Wt 81.6 kg   LMP 01/01/2024 (Exact Date)   SpO2 99%   BMI 30.90 kg/m  Physical Exam Vitals and nursing note reviewed.  Constitutional:      General: She is not in acute distress.    Appearance: She is well-developed. She is not diaphoretic.  HENT:     Head: Normocephalic and atraumatic.     Comments: Swollen turbinates posterior nasal drip.  TMs are normal bilaterally. Eyes:     Pupils: Pupils are equal, round, and reactive to light.  Cardiovascular:     Rate and Rhythm: Normal rate and regular rhythm.     Heart sounds: No murmur heard.    No friction rub. No gallop.  Pulmonary:     Effort: Pulmonary effort is normal.     Breath sounds: No wheezing or rales.  Abdominal:     General: There is no distension.     Palpations: Abdomen is soft.     Tenderness: There is no abdominal tenderness.  Musculoskeletal:        General: No tenderness.     Cervical back: Normal range of motion and  neck supple.  Skin:    General: Skin is warm and dry.  Neurological:     Mental Status: She is alert and oriented to person, place, and time.  Psychiatric:        Behavior: Behavior normal.     ED Results / Procedures / Treatments   Labs (all labs ordered are listed, but only abnormal results are displayed) Labs Reviewed  RESP PANEL BY RT-PCR (RSV, FLU A&B, COVID)  [id number] - Abnormal; Notable for the following components:      Result Value   SARS Coronavirus 2 by RT PCR POSITIVE (*)    All other components within normal limits    EKG None  Radiology No results found.  Procedures Procedures    Medications Ordered in ED Medications  ketorolac (TORADOL) 15 MG/ML injection 15 mg (15 mg Intramuscular Given 01/23/24 1610)    ED Course/ Medical Decision Making/ A&P                                 Medical Decision  Making Risk Prescription drug management.   28 yo F with a chief complaints of cough congestion fever chills myalgias going on for about 12 to 24 hours.  She is well-appearing nontoxic.  Appears well-hydrated.  No bacterial source was found on exam.  She had viral testing through triage, is COVID-positive.  Risks and benefits of Paxlovid therapy discussed.  Will hold off at this time.  PCP follow-up.  5:06 AM:  I have discussed the diagnosis/risks/treatment options with the patient and family.  Evaluation and diagnostic testing in the emergency department does not suggest an emergent condition requiring admission or immediate intervention beyond what has been performed at this time.  They will follow up with PCP. We also discussed returning to the ED immediately if new or worsening sx occur. We discussed the sx which are most concerning (e.g., sudden worsening pain, fever, inability to tolerate by mouth) that necessitate immediate return. Medications administered to the patient during their visit and any new prescriptions provided to the patient are listed below.  Medications given during this visit Medications  ketorolac (TORADOL) 15 MG/ML injection 15 mg (15 mg Intramuscular Given 01/23/24 9604)     The patient appears reasonably screen and/or stabilized for discharge and I doubt any other medical condition or other [facility] requiring further screening, evaluation, or treatment in the ED at this time prior to discharge.          Final Clinical Impression(s) / ED Diagnoses Final diagnoses:  COVID-19 virus infection    Rx / DC Orders ED Discharge Orders          Ordered    benzonatate (TESSALON) 100 MG capsule  Every 8 hours        01/23/24 0405    ondansetron (ZOFRAN-ODT) 4 MG disintegrating tablet        01/23/24 0405              Melene Plan, DO 01/23/24 [phone]

## 2024-03-17 DIAGNOSIS — N760 Acute vaginitis: Secondary | ICD-10-CM | POA: Diagnosis not present

## 2024-03-17 DIAGNOSIS — Z118 Encounter for screening for other infectious and parasitic diseases: Secondary | ICD-10-CM | POA: Diagnosis not present

## 2024-07-27 ENCOUNTER — Other Ambulatory Visit: Payer: Self-pay

## 2024-07-27 ENCOUNTER — Emergency Department (HOSPITAL_COMMUNITY)

## 2024-07-27 ENCOUNTER — Emergency Department (HOSPITAL_COMMUNITY)
Admission: EM | Admit: 2024-07-27 | Discharge: 2024-07-27 | Disposition: A | Discharge status: Home / Self Care | Source: Ambulatory Visit

## 2024-07-27 ENCOUNTER — Encounter (HOSPITAL_COMMUNITY): Payer: Self-pay

## 2024-07-27 DIAGNOSIS — R000 Tachycardia, unspecified: Secondary | ICD-10-CM | POA: Diagnosis not present

## 2024-07-27 DIAGNOSIS — I517 Cardiomegaly: Secondary | ICD-10-CM | POA: Diagnosis not present

## 2024-07-27 DIAGNOSIS — I10 Essential (primary) hypertension: Secondary | ICD-10-CM | POA: Insufficient documentation

## 2024-07-27 DIAGNOSIS — Z79899 Other long term (current) drug therapy: Secondary | ICD-10-CM | POA: Diagnosis not present

## 2024-07-27 DIAGNOSIS — R079 Chest pain, unspecified: Secondary | ICD-10-CM | POA: Diagnosis not present

## 2024-07-27 DIAGNOSIS — Z8616 Personal history of COVID-19: Secondary | ICD-10-CM | POA: Diagnosis not present

## 2024-07-27 DIAGNOSIS — R0602 Shortness of breath: Secondary | ICD-10-CM | POA: Diagnosis not present

## 2024-07-27 DIAGNOSIS — R030 Elevated blood-pressure reading, without diagnosis of hypertension: Secondary | ICD-10-CM | POA: Diagnosis not present

## 2024-07-27 LAB — CBC
HCT: 42.9 % (ref 36.0–46.0)
Hemoglobin: 13.7 g/dL (ref 12.0–15.0)
MCH: 27.2 pg (ref 26.0–34.0)
MCHC: 31.9 g/dL (ref 30.0–36.0)
MCV: 85.1 fL (ref 80.0–100.0)
Platelets: 289 K/uL (ref 150–400)
RBC: 5.04 MIL/uL (ref 3.87–5.11)
RDW: 14.1 % (ref 11.5–15.5)
WBC: 6.9 K/uL (ref 4.0–10.5)
nRBC: 0 % (ref 0.0–0.2)

## 2024-07-27 LAB — BASIC METABOLIC PANEL WITH GFR
Anion gap: 8 (ref 5–15)
BUN: 11 mg/dL (ref 6–20)
CO2: 27 mmol/L (ref 22–32)
Calcium: 9.5 mg/dL (ref 8.9–10.3)
Chloride: 105 mmol/L (ref 98–111)
Creatinine, Ser: 0.67 mg/dL (ref 0.44–1.00)
GFR, Estimated: >60 mL/min (ref >60)
Glucose, Bld: 96 mg/dL (ref 70–99)
Potassium: 4 mmol/L (ref 3.5–5.1)
Sodium: 140 mmol/L (ref 135–145)

## 2024-07-27 LAB — D-DIMER, QUANTITATIVE: D-Dimer, Quant: 0.7 ug{FEU}/mL — ABNORMAL HIGH (ref 0.00–0.50)

## 2024-07-27 LAB — HCG, SERUM, QUALITATIVE: Preg, Serum: NEGATIVE

## 2024-07-27 LAB — TSH: TSH: 0.749 u[IU]/mL (ref 0.350–4.500)

## 2024-07-27 LAB — MAGNESIUM: Magnesium: 2 mg/dL (ref 1.7–2.4)

## 2024-07-27 MED ORDER — IOHEXOL 350 MG/ML SOLN
75.0000 mL | Freq: Once | INTRAVENOUS | Status: AC | PRN
  Administered 2024-07-27: 75 mL via INTRAVENOUS

## 2024-07-27 MED ORDER — ACETAMINOPHEN 500 MG PO TABS
1000.0000 mg | ORAL_TABLET | Freq: Once | ORAL | Status: AC
  Administered 2024-07-27: 1000 mg via ORAL
  Filled 2024-07-27: qty 2

## 2024-07-27 MED ORDER — AMLODIPINE BESYLATE 5 MG PO TABS: 5.0000 mg | ORAL_TABLET | Freq: Every day | ORAL | 2 refills | Status: AC

## 2024-07-27 NOTE — ED Triage Notes (Signed)
 Patient sent from urgent care due to high blood pressure of 151/100 and high heart rate around 110-130. Stated she has been ignoring it for a while but symptoms worsened on Friday. Has been feeling winded. Has a constant headache. Had covid in April and the symptoms began then.

## 2024-07-27 NOTE — ED Provider Notes (Signed)
 [place] [facility] AT [facility] Provider Note   CSN: [id number] Arrival date & time: 07/27/24  1022     Patient presents with: Hypertension   Savannah Baldwin is a 28 y.o. female.    Hypertension Pertinent negatives include no chest pain, no abdominal pain and no shortness of breath.   Presents because of hypertension as well as episodes of tachycardia.  Patient states that she feels like most of her symptoms started after being diagnosed with COVID back in April.  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  Notes that sometimes at rest heart rate will go up to 150s and 160s.  No chest pain.  Maybe some mild shortness of breath during his episodes.  Patient states that she has been feeling fatigued and recently as well along with these episodes of tachycardia.  She also states that she takes a blood pressure 2-3 times daily.  Diastolic is usually above 100.  Systolic usually in the 140s.  Not take any medication for hypertension this point time.  No history DVT or PE.  No cardiac history.  No syncope.  No exertional chest pain.  No unilateral leg swelling.  Previous medical history reviewed : Patient was last seen in the ED in April 2025.  Tested positive for COVID.      Prior to Admission medications   Medication Sig Start Date End Date Taking? Authorizing Provider  amLODipine (NORVASC) 5 MG tablet Take 1 tablet (5 mg total) by mouth daily. 07/27/24  Yes Simon Lavonia SAILOR, MD  acetaminophen  (TYLENOL ) 160 MG/5ML liquid Take 500 mg by mouth every 4 (four) hours as needed for fever.     [provider]  albuterol  (PROVENTIL  HFA;VENTOLIN  HFA) 108 (90 Base) MCG/ACT inhaler Inhale 1-2 puffs into the lungs every 6 (six) hours as needed for wheezing or shortness of breath. 09/09/18   Vicci Barnie NOVAK, MD  benzonatate  (TESSALON ) 100 MG capsule Take 1 capsule (100 mg total) by mouth every 8 (eight) hours. 01/23/24   Floyd, Dan, DO  esomeprazole (NEXIUM) 40 MG  capsule Take 40 mg by mouth daily as needed for heartburn.    [provider]  Etonogestrel (NEXPLANON [place]) Inject into the skin.    [provider]  ibuprofen  (ADVIL ,MOTRIN ) 800 MG tablet Take 1 tablet (800 mg total) by mouth every 8 (eight) hours as needed (for fever or pain). 09/09/18   Vicci Barnie NOVAK, MD  naproxen  (NAPROSYN ) 500 MG tablet Take 1 tablet (500 mg total) by mouth 2 (two) times daily with a meal. 08/06/20   Robinson, [place] N, PA-C  ondansetron  (ZOFRAN ) 4 MG tablet Take 1 tablet (4 mg total) by mouth every 6 (six) hours. Patient not taking: Reported on 09/09/2018 09/04/18   Maczis, Michael M, PA-C  ondansetron  (ZOFRAN -ODT) 4 MG disintegrating tablet 4mg  ODT q4 hours prn nausea/vomit 01/23/24   Floyd, Dan, DO    Allergies: Penicillins    Review of Systems  Constitutional:  Negative for chills and fever.  HENT:  Negative for ear pain and sore throat.   Eyes:  Negative for pain and visual disturbance.  Respiratory:  Negative for cough and shortness of breath.   Cardiovascular:  Negative for chest pain and palpitations.  Gastrointestinal:  Negative for abdominal pain and vomiting.  Genitourinary:  Negative for dysuria and hematuria.  Musculoskeletal:  Negative for arthralgias and back pain.  Skin:  Negative for color change and rash.  Neurological:  Negative for seizures and syncope.  All  other systems reviewed and are negative.   Updated Vital Signs BP (!) 155/81   Pulse 68   Temp 98.7 F (37.1 C) (Oral)   Resp 19   Ht 5' 3 (1.6 m)   Wt 77.1 kg   SpO2 100%   BMI 30.11 kg/m   Physical Exam Vitals and nursing note reviewed.  Constitutional:      General: She is not in acute distress.    Appearance: She is well-developed.  HENT:     Head: Normocephalic and atraumatic.  Eyes:     Conjunctiva/sclera: Conjunctivae normal.  Cardiovascular:     Rate and Rhythm: Normal rate and regular rhythm.     Heart sounds: No murmur heard. Pulmonary:      Effort: Pulmonary effort is normal. No respiratory distress.     Breath sounds: Normal breath sounds.  Abdominal:     Palpations: Abdomen is soft.     Tenderness: There is no abdominal tenderness.  Musculoskeletal:        General: No swelling.     Cervical back: Neck supple.  Skin:    General: Skin is warm and dry.     Capillary Refill: Capillary refill takes less than 2 seconds.  Neurological:     Mental Status: She is alert.  Psychiatric:        Mood and Affect: Mood normal.     (all labs ordered are listed, but only abnormal results are displayed) Labs Reviewed  D-DIMER, QUANTITATIVE (NOT AT [facility]) - Abnormal; Notable for the following components:      Result Value   D-Dimer, Quant 0.70 (*)    All other components within normal limits  CBC  BASIC METABOLIC PANEL WITH GFR  HCG, SERUM, QUALITATIVE  MAGNESIUM  TSH    EKG: EKG Interpretation Date/Time:  Monday July 27 2024 10:54:18 EDT Ventricular Rate:  68 PR Interval:  168 QRS Duration:  79 QT Interval:  397 QTC Calculation: 423 R Axis:   37  Text Interpretation: Sinus rhythm Confirmed by Simon Rea [id number] on 07/27/2024 11:14:59 AM  Radiology: CT Angio Chest PE W and/or Wo Contrast Result Date: 07/27/2024 CLINICAL DATA:  Hypertension, tachycardia. EXAM: CT ANGIOGRAPHY CHEST WITH CONTRAST TECHNIQUE: Multidetector CT imaging of the chest was performed using the standard protocol during bolus administration of intravenous contrast. Multiplanar CT image reconstructions and MIPs were obtained to evaluate the vascular anatomy. RADIATION DOSE REDUCTION: This exam was performed according to the departmental dose-optimization program which includes automated exposure control, adjustment of the mA and/or kV according to patient size and/or use of iterative reconstruction technique. CONTRAST:  75mL OMNIPAQUE IOHEXOL 350 MG/ML SOLN COMPARISON:  None Available. FINDINGS: Cardiovascular: Negative for pulmonary embolus. Heart is  enlarged. Left ventricle appears hypertrophied. No pericardial effusion. Mediastinum/Nodes: Thymus in the prevascular space. No pathologically enlarged mediastinal, hilar or axillary lymph nodes. Esophagus is grossly unremarkable. Lungs/Pleura: Lungs are clear. No pleural fluid. Airway is unremarkable. Upper Abdomen: Visualized portions of the liver, gallbladder, adrenal glands, kidneys, spleen, pancreas, stomach and bowel are grossly unremarkable. No upper abdominal adenopathy. Musculoskeletal: Minimal degenerative change in the spine. Review of the MIP images confirms the above findings. IMPRESSION: 1. Negative for pulmonary embolus. 2. Cardiac enlargement with left ventricular hypertrophy. Electronically Signed   By: Newell Eke M.D.   On: 07/27/2024 13:03     Procedures   Medications Ordered in the ED  acetaminophen  (TYLENOL ) tablet 1,000 mg (1,000 mg Oral Given 07/27/24 1058)  iohexol (OMNIPAQUE) 350 MG/ML injection 75  mL (75 mLs Intravenous Contrast Given 07/27/24 1219)                                    Medical Decision Making Amount and/or Complexity of Data Reviewed Labs: ordered. Radiology: ordered.  Risk OTC drugs. Prescription drug management.     HPI:   Presents because of hypertension as well as episodes of tachycardia.  Patient states that she feels like most of her symptoms started after being diagnosed with COVID back in April.  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  Notes that sometimes at rest heart rate will go up to 150s and 160s.  No chest pain.  Maybe some mild shortness of breath during his episodes.  Patient states that she has been feeling fatigued and recently as well along with these episodes of tachycardia.  She also states that she takes a blood pressure 2-3 times daily.  Diastolic is usually above 100.  Systolic usually in the 140s.  Not take any medication for hypertension this point time.  No history DVT or PE.  No cardiac history.  No syncope.   No exertional chest pain.  No unilateral leg swelling.  Previous medical history reviewed : Patient was last seen in the ED in April 2025.  Tested positive for COVID. MDM:    In terms of patient's episodes of tachycardia, EKG here shows sinus rhythm.  No STEMI arrhythmia.  Cardiac telemetry on the patient while she was here in the ED.    Given unexplained episodes of tachycardia, did obtain D-dimer as well to rule out DVT or PE.  No risk factors for DVT or PE.  Low risk.   Also obtain an electrolyte workup given episodes of tachycardia.  Will obtain magnesium as well as CMP panel and TSH.  Electrolyte workup unremarkable.  No hypokalemia.  Magnesium 2.  TSH normal as well.  Did obtain D-dimer given patient's symptoms.  D-dimer elevated.  Obtain CT of the chest.  No PE.  Does show some cardiac enlargement.  In terms of the cardiac enlargement, my concern is that it could be having uncontrolled hypertension at home.  Patient is been taken blood pressure at home and seems to be consistently high.  She remains high here in the ED as well.  Therefore, I do think it is reasonable to start a very small dose of amlodipine to further address this.  She will need outpatient echo with cardiology.  They will likely do a Holter monitor as well given patient's episodes of tachycardia that she endorses at home.  She is remained in sinus rhythm in the ED throughout the 3 hours that she has been here.   On reevaluation.  Patient has no complaints at this time.  Asymptomatic.   I do feel comfortable discharging the patient home at this time.  Strict return precautions    EKG Interpreted by Me: NSR    Cardiac Tele Interpreted by Me: NSR   I have independently interpreted the CT  images and agree with the radiologist finding   Social Determinant of Health:    Disposition and Follow Up: Cardiology         Final diagnoses:  Hypertension, unspecified type  Cardiac enlargement    ED  Discharge Orders          Ordered    amLODipine (NORVASC) 5 MG tablet  Daily  07/27/24 1344    Ambulatory referral to Cardiology        07/27/24 1346               Simon Lavonia SAILOR, MD 07/27/24 1356

## 2024-07-27 NOTE — Discharge Instructions (Addendum)
 Usually do  not like starting blood pressure medications from the ED but given your continued high blood pressure readings at home as well as your slight enlargement of your heart, I do think it is reasonable to start you on these medications.  I do want you to follow-up with cardiology given your cardiac enlargement.  They will perform echo.  They will also likely want to perform a Holter monitor given your episodes of feeling like your heart is racing.  Had no arrhythmia since he been here in the ED.  If is any kind of chest pain or shortness of breath then  please come back to ED for further care.

## 2024-07-28 NOTE — Progress Notes (Unsigned)
 Cardiology Office Note:  .   Date:  07/29/2024  ID:  Savannah Baldwin, DOB [birth year], MRN [id number] PCP: Patient, No Pcp Per  [facility] Providers Cardiologist:  None { History of Present Illness: .    Chief Complaint  Patient presents with   cardiomegaly    Savannah Baldwin is a 28 y.o. female with history of asthma who presents for the evaluation of palpitations/cardiomegaly at the request of Simon Lavonia SAILOR, MD. Seen in ER 10/14 for tachycardia and elevated BP. CT PE negative but mention of cardiomegaly.    History of Present Illness   Savannah Baldwin is a 28 year old female who presents with tachycardia and elevated blood pressure. She is accompanied by her father. She was referred for evaluation after a CT scan suggested an enlarged heart.  She experienced tachycardia and elevated blood pressure during an ER visit on July 27, 2024, with a resting heart rate between 119 and 130 bpm. She has had a persistent headache since July 24, 2024, described as a 'band around her head,' which temporarily eases with Tylenol  but returns upon activity. No history of migraines, aura, or vision changes. The headache has not improved with the initiation of amlodipine 5 mg daily, which was started on July 27, 2024.  Her home blood pressure was 160/102 mmHg, and today it is 144/84 mmHg. She reports intermittent tachycardia, with her heart rate reaching 116 bpm yesterday, as monitored by her watch. She reports that for the most part she does not feel the elevated heart rate and just notices with watch.   Her family history is significant for her father having diabetes, hypertension, and high cholesterol. She has no personal history of diabetes, heart attack, or stroke. She has a history of ACL repair and tooth surgery.  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 She describes herself as a 'workaholic' and experiences normal work-related stress.        ROS: All  other ROS reviewed and negative. Pertinent positives noted in the HPI.     Studies Reviewed: .       HR 68 bpm Physical Exam:   VS:  BP (!) 144/84   Pulse 70   Ht 5' 3 (1.6 m)   Wt 174 lb (78.9 kg)   SpO2 95%   BMI 30.82 kg/m    Wt Readings from Last 3 Encounters:  07/29/24 174 lb (78.9 kg)  07/27/24 170 lb (77.1 kg)  01/22/24 180 lb (81.6 kg)    GEN: Well nourished, well developed in no acute distress NECK: No JVD; No carotid bruits CARDIAC: RRR, no murmurs, rubs, gallops RESPIRATORY:  Clear to auscultation without rales, wheezing or rhonchi  ABDOMEN: Soft, non-tender, non-distended EXTREMITIES:  No edema; No deformity  ASSESSMENT AND PLAN: .   Assessment and Plan    Intermittent tachycardia Reports of intermittent tachycardia with heart rate up to 119 bpm. EKG shows sinus rhythm. No symptoms. Incidental finding.  - Monitor heart rate using Apple Watch. - Perform EKG on Apple Watch if tachycardia occurs and send results for review.  Cardiomegaly under evaluation CT scan suggested possible cardiomegaly, but heart appeared normal on review. No signs of heart failure. - Order echocardiogram to evaluate for cardiomegaly.  HTN Current blood pressure is 144/84, close to goal. - Continue amlodipine 5 mg daily. - Monitor blood pressure at home with a goal of less than 140/90. - Contact if blood pressure remains elevated before primary care appointment  in December.  Headache, unspecified Persistent headache, possibly tension-type or migraine. Not improved with Tylenol  or ibuprofen . - Try Excedrin for headache relief. - If headache persists, consider expediting primary care appointment for further evaluation.              Follow-up: Return if symptoms worsen or fail to improve.   Signed, Darryle DASEN. Barbaraann, MD, [facility]  [facility]  [address] [place], [place] [address] [phone]  3:31 PM

## 2024-07-29 ENCOUNTER — Encounter: Payer: Self-pay | Admitting: Cardiovascular Disease

## 2024-07-29 ENCOUNTER — Ambulatory Visit: Attending: Cardiovascular Disease | Admitting: Cardiovascular Disease

## 2024-07-29 VITALS — BP 144/84 | HR 70 | Ht 63.0 in | Wt 174.0 lb

## 2024-07-29 DIAGNOSIS — I517 Cardiomegaly: Secondary | ICD-10-CM | POA: Diagnosis not present

## 2024-07-29 DIAGNOSIS — I150 Renovascular hypertension: Secondary | ICD-10-CM | POA: Diagnosis not present

## 2024-07-29 DIAGNOSIS — R519 Headache, unspecified: Secondary | ICD-10-CM

## 2024-07-29 DIAGNOSIS — R002 Palpitations: Secondary | ICD-10-CM

## 2024-07-29 NOTE — Patient Instructions (Signed)
 Medication Instructions:  Your physician recommends that you continue on your current medications as directed. Please refer to the Current Medication list given to you today.  *If you need a refill on your cardiac medications before your next appointment, please call your pharmacy*  Lab Work: NONE  If you have labs (blood work) drawn today and your tests are completely normal, you will receive your results only by: MyChart Message (if you have MyChart) OR A paper copy in the mail If you have any lab test that is abnormal or we need to change your treatment, we will call you to review the results.  Testing/Procedures: Your physician has requested that you have an echocardiogram. Echocardiography is a painless test that uses sound waves to create images of your heart. It provides your doctor with information about the size and shape of your heart and how well your heart's chambers and valves are working. This procedure takes approximately one hour. There are no restrictions for this procedure. Please do NOT wear cologne, perfume, aftershave, or lotions (deodorant is allowed). Please arrive 15 minutes prior to your appointment time.  Please note: We ask at that you not bring children with you during ultrasound (echo/ vascular) testing. Due to room size and safety concerns, children are not allowed in the ultrasound rooms during exams. Our front office staff cannot provide observation of children in our lobby area while testing is being conducted. An adult accompanying a patient to their appointment will only be allowed in the ultrasound room at the discretion of the ultrasound technician under special circumstances. We apologize for any inconvenience.   Follow-Up:As needed   At [facility], you and your health needs are our priority.  As part of our continuing mission to provide you with exceptional heart care, our providers are all part of one team.  This team includes your primary  Cardiologist (physician) and Advanced Practice Providers or APPs (Physician Assistants and Nurse Practitioners) who all work together to provide you with the care you need, when you need it.   Provider:   Dr. Burton  We recommend signing up for the patient portal called MyChart.  Sign up information is provided on this After Visit Summary.  MyChart is used to connect with patients for Virtual Visits (Telemedicine).  Patients are able to view lab/test results, encounter notes, upcoming appointments, etc.  Non-urgent messages can be sent to your provider as well.   To learn more about what you can do with MyChart, go to [email or web address].

## 2024-08-20 ENCOUNTER — Encounter (HOSPITAL_BASED_OUTPATIENT_CLINIC_OR_DEPARTMENT_OTHER): Payer: Self-pay

## 2024-08-21 ENCOUNTER — Ambulatory Visit (INDEPENDENT_AMBULATORY_CARE_PROVIDER_SITE_OTHER)

## 2024-08-21 DIAGNOSIS — R519 Headache, unspecified: Secondary | ICD-10-CM

## 2024-08-21 DIAGNOSIS — I517 Cardiomegaly: Secondary | ICD-10-CM | POA: Diagnosis not present

## 2024-08-21 DIAGNOSIS — I150 Renovascular hypertension: Secondary | ICD-10-CM | POA: Diagnosis not present

## 2024-08-21 DIAGNOSIS — R002 Palpitations: Secondary | ICD-10-CM | POA: Diagnosis not present

## 2024-08-24 ENCOUNTER — Ambulatory Visit: Payer: Self-pay | Admitting: Cardiovascular Disease

## 2024-08-24 LAB — ECHOCARDIOGRAM COMPLETE
AR max vel: 2.12 cm2
AV Area VTI: 1.96 cm2
AV Area mean vel: 1.84 cm2
AV Mean grad: 3.5 mmHg
AV Peak grad: 6.5 mmHg
Ao pk vel: 1.27 m/s
Area-P 1/2: 3.6 cm2
S' Lateral: 2.8 cm

## 2024-09-03 ENCOUNTER — Ambulatory Visit: Admitting: Family Medicine

## 2024-09-07 ENCOUNTER — Ambulatory Visit

## 2024-09-12 DIAGNOSIS — S62339A Displaced fracture of neck of unspecified metacarpal bone, initial encounter for closed fracture: Secondary | ICD-10-CM | POA: Diagnosis not present

## 2024-09-12 DIAGNOSIS — M79641 Pain in right hand: Secondary | ICD-10-CM | POA: Diagnosis not present

## 2024-09-16 DIAGNOSIS — S62336A Displaced fracture of neck of fifth metacarpal bone, right hand, initial encounter for closed fracture: Secondary | ICD-10-CM | POA: Diagnosis not present

## 2024-10-12 DIAGNOSIS — Z23 Encounter for immunization: Secondary | ICD-10-CM | POA: Diagnosis not present

## 2024-10-12 DIAGNOSIS — J4520 Mild intermittent asthma, uncomplicated: Secondary | ICD-10-CM | POA: Diagnosis not present

## 2024-10-12 DIAGNOSIS — Z9109 Other allergy status, other than to drugs and biological substances: Secondary | ICD-10-CM | POA: Diagnosis not present

## 2024-10-12 DIAGNOSIS — Z1330 Encounter for screening examination for mental health and behavioral disorders, unspecified: Secondary | ICD-10-CM | POA: Diagnosis not present

## 2024-10-12 DIAGNOSIS — I10 Essential (primary) hypertension: Secondary | ICD-10-CM | POA: Diagnosis not present
